# Patient Record
Sex: Male | Born: 1970 | Race: White | Hispanic: No | Marital: Married | Smoking: Former smoker
Health system: Southern US, Community
[De-identification: ages and names within clinical notes are randomized; demographics above are authoritative.]

## PROBLEM LIST (undated history)

## (undated) DIAGNOSIS — S82899A Other fracture of unspecified lower leg, initial encounter for closed fracture: Secondary | ICD-10-CM

## (undated) DIAGNOSIS — T4145XA Adverse effect of unspecified anesthetic, initial encounter: Secondary | ICD-10-CM

## (undated) DIAGNOSIS — E119 Type 2 diabetes mellitus without complications: Secondary | ICD-10-CM

## (undated) DIAGNOSIS — M5126 Other intervertebral disc displacement, lumbar region: Secondary | ICD-10-CM

## (undated) DIAGNOSIS — J329 Chronic sinusitis, unspecified: Secondary | ICD-10-CM

## (undated) DIAGNOSIS — S8292XA Unspecified fracture of left lower leg, initial encounter for closed fracture: Secondary | ICD-10-CM

## (undated) DIAGNOSIS — I10 Essential (primary) hypertension: Secondary | ICD-10-CM

## (undated) DIAGNOSIS — M1990 Unspecified osteoarthritis, unspecified site: Secondary | ICD-10-CM

## (undated) DIAGNOSIS — G4730 Sleep apnea, unspecified: Secondary | ICD-10-CM

## (undated) HISTORY — PX: OTHER SURGICAL HISTORY: SHX169

---

## 1999-11-03 DIAGNOSIS — T8859XA Other complications of anesthesia, initial encounter: Secondary | ICD-10-CM

## 1999-11-03 HISTORY — DX: Other complications of anesthesia, initial encounter: T88.59XA

## 1999-11-03 HISTORY — PX: OTHER SURGICAL HISTORY: SHX169

## 1999-12-04 ENCOUNTER — Other Ambulatory Visit: Admission: RE | Admit: 1999-12-04 | Discharge: 1999-12-04 | Payer: Self-pay

## 2009-11-02 DIAGNOSIS — S82899A Other fracture of unspecified lower leg, initial encounter for closed fracture: Secondary | ICD-10-CM

## 2009-11-02 DIAGNOSIS — S8292XA Unspecified fracture of left lower leg, initial encounter for closed fracture: Secondary | ICD-10-CM

## 2009-11-02 HISTORY — DX: Other fracture of unspecified lower leg, initial encounter for closed fracture: S82.899A

## 2009-11-02 HISTORY — DX: Unspecified fracture of left lower leg, initial encounter for closed fracture: S82.92XA

## 2009-11-02 HISTORY — PX: OTHER SURGICAL HISTORY: SHX169

## 2010-06-20 ENCOUNTER — Inpatient Hospital Stay (HOSPITAL_COMMUNITY): Admission: EM | Admit: 2010-06-20 | Discharge: 2010-06-23 | Payer: Self-pay

## 2010-07-08 ENCOUNTER — Ambulatory Visit (HOSPITAL_COMMUNITY): Admission: RE | Admit: 2010-07-08 | Discharge: 2010-07-09 | Payer: Self-pay | Admitting: Orthopedic Surgery

## 2010-12-29 IMAGING — CT CT EXTREM LOW W/O CM*L*
3 series · 16 of 33 positions shown, 19 images · non-contrast
Comparison: Left ankle radiographs 06/20/2010

CLINICAL DATA: Evaluate left ankle fracture.  Status post external
fixation.

CT LEFT ANKLE WITHOUT CONTRAST
TECHNIQUE: Multidetector CT imaging of the left ankle was
performed according to the standard protocol without intravenous
contrast. Multiplanar CT image reconstructions were also generated.

[Series 4: lt ankle 2 · axial · 0.43mm/px · z∈[-500,-284]mm · 8 of 128 slices shown, 10 images]
[im 10/128  soft-tissue]
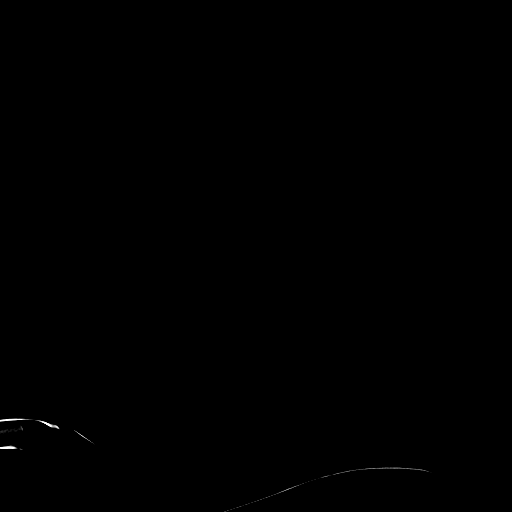
[im 10/128  bone]
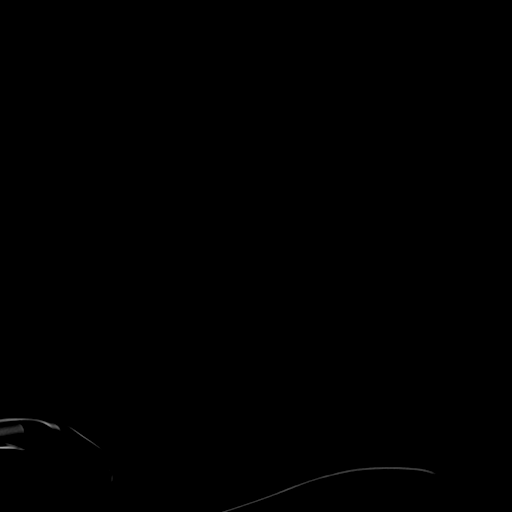
[im 30/128  bone]
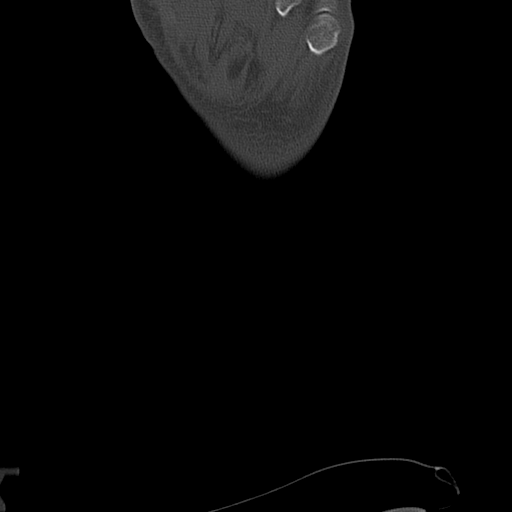
[im 40/128  bone]
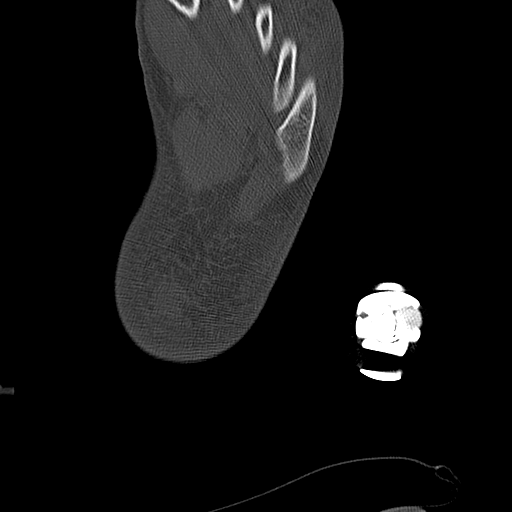
[im 59/128  bone]
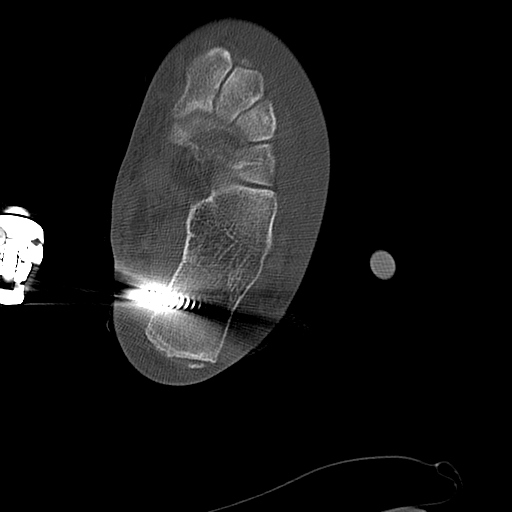
[im 69/128  soft-tissue]
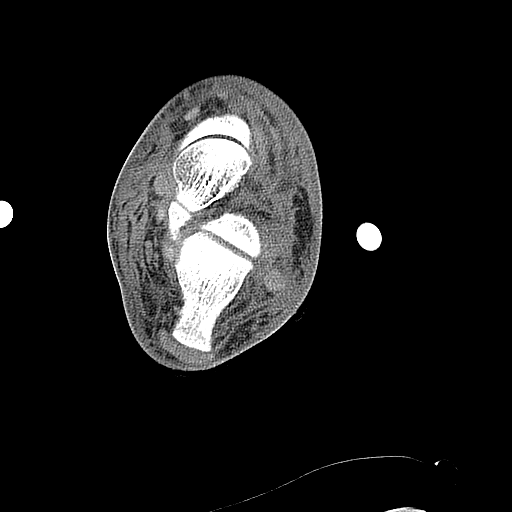
[im 69/128  bone]
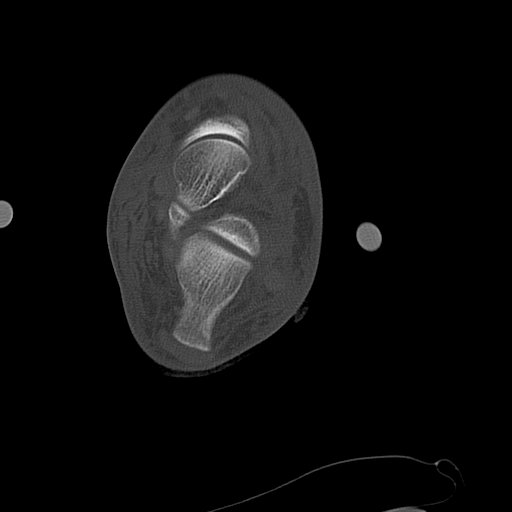
[im 88/128  bone]
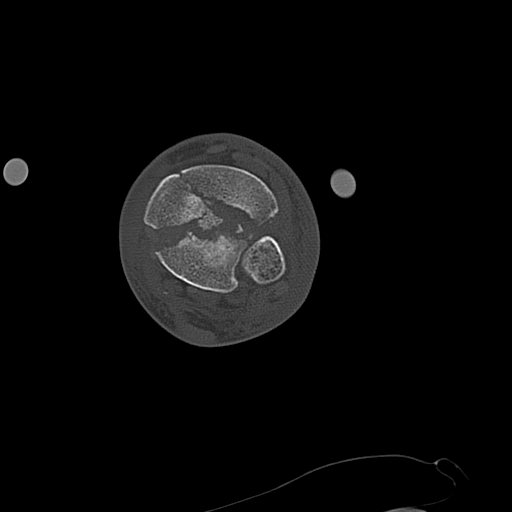
[im 98/128  bone]
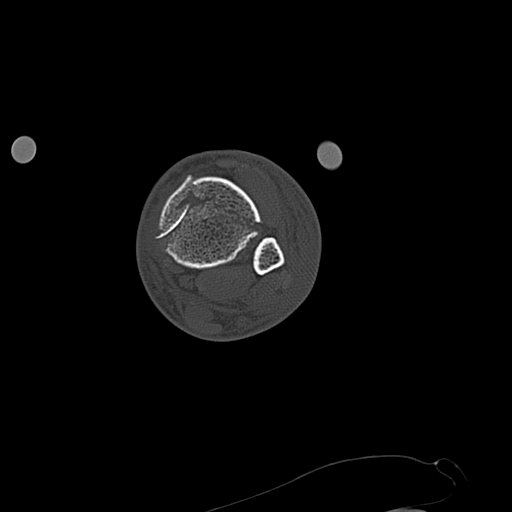
[im 118/128  bone]
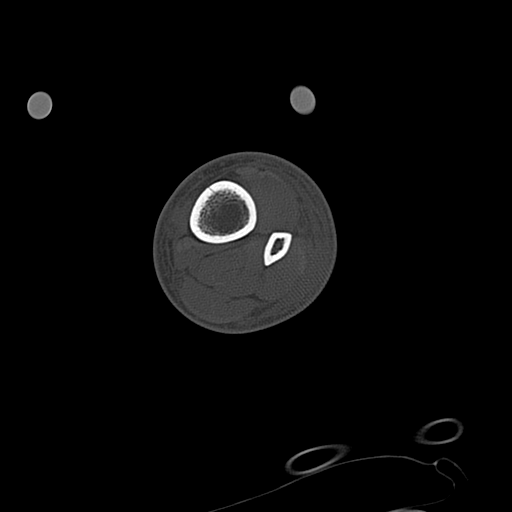

[Series 604: lt ankle cor · coronal · 0.50mm/px · 3 of 76 slices shown]
[im 16/76  bone]
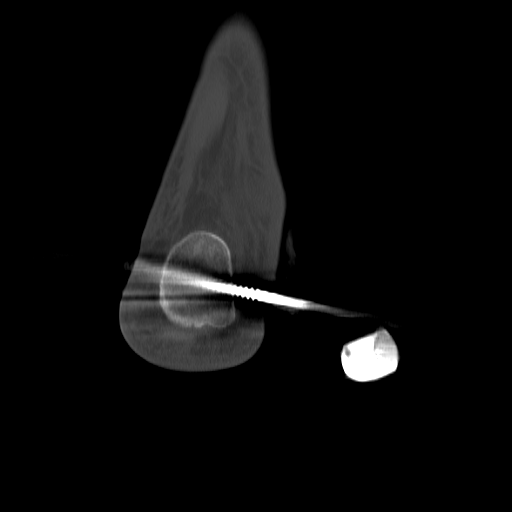
[im 31/76  bone]
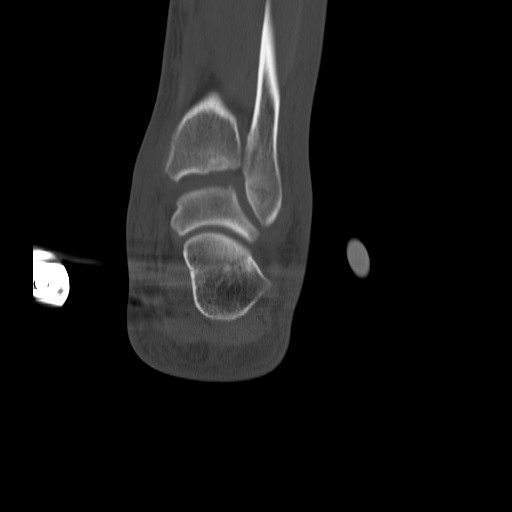
[im 46/76  bone]
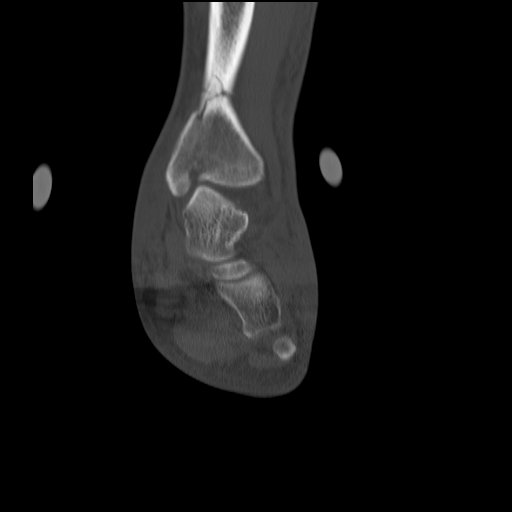

[Series 605: sag lt ankle · sagittal · 0.50mm/px · 5 of 48 slices shown, 6 images]
[im 16/48  bone]
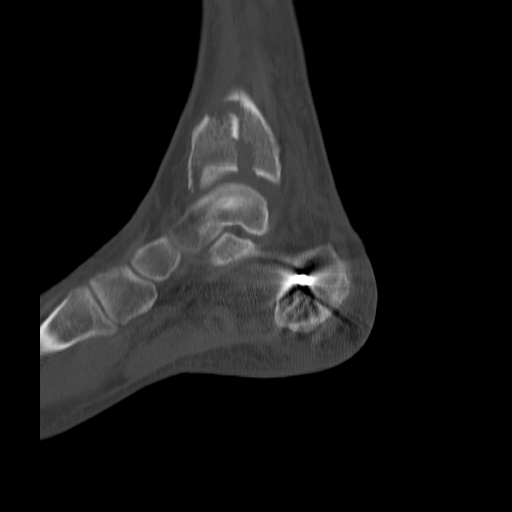
[im 20/48  bone]
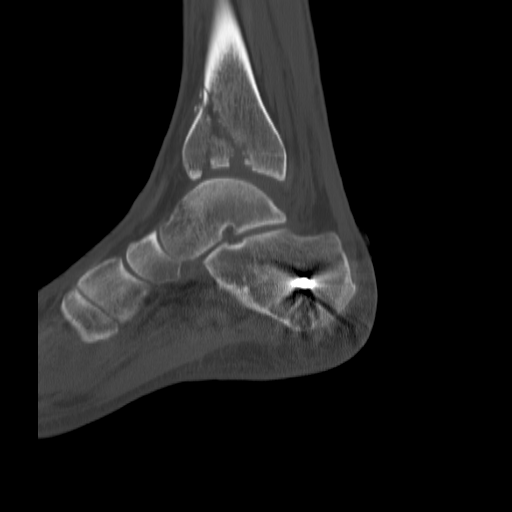
[im 24/48  soft-tissue]
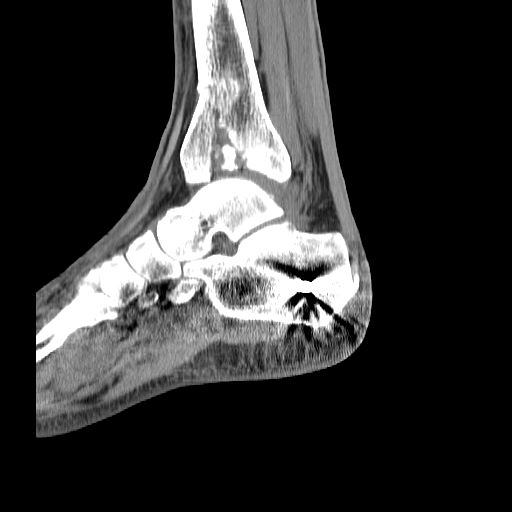
[im 24/48  bone]
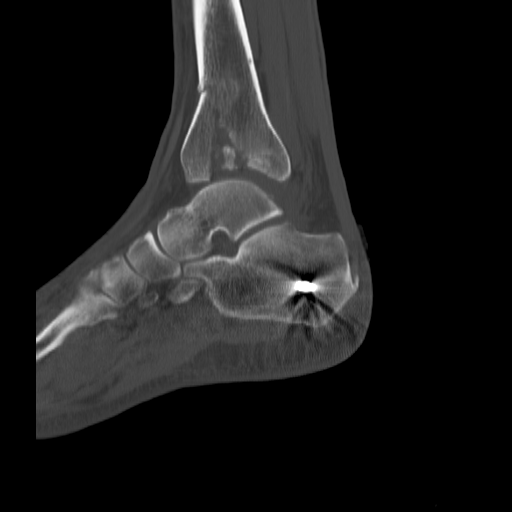
[im 28/48  bone]
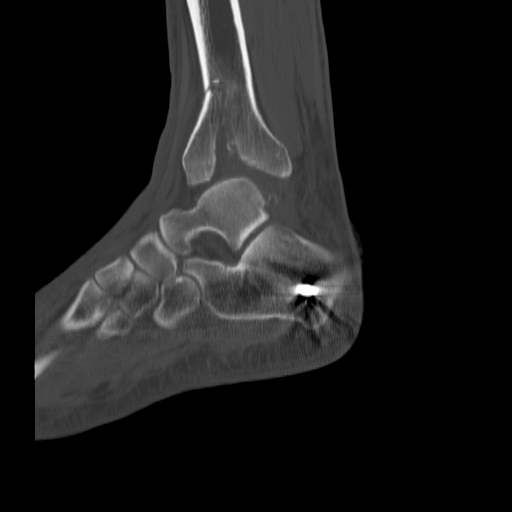
[im 32/48  bone]
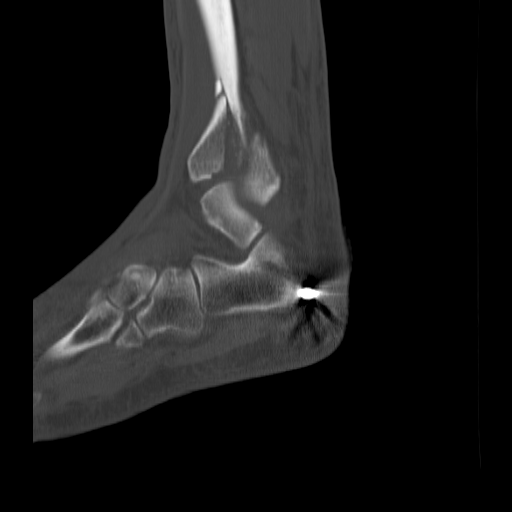

[16 of 33 positions shown; findings below may reference images not displayed]

FINDINGS: There is an external fixator in place with tibial and
calcaneal screws.  There is a nondisplaced fracture involving the
calcaneus along its plantar are aspect posteriorly.  The body of
the calcaneus is intact and the talus and subtalar joints are
intact.

There is a severely comminuted die punch type fracture involving
the distal tibia with severe comminution and fragmentation.  There
is a large gap in the articular surface.

There is widening of the talofibular joint space posteriorly with
small bony fragments near the posterior talofibular ligament
attachment suggesting ligament tear and avulsion.
IMPRESSION: 1.  Severely comminuted die punch type fracture involving the
distal tibia and tibial plafond with multiple small bone fragments
and large gaps in the articular surface.
2.  Nondisplaced fracture involving the plantar and posterior
aspect of the calcaneus.
3.  Findings suspicious for a a posterior talofibular ligament tear
with small bony avulsions.

## 2011-01-15 LAB — CBC
HCT: 39.3 % (ref 39.0–52.0)
Hemoglobin: 13.4 g/dL (ref 13.0–17.0)
MCH: 30 pg (ref 26.0–34.0)
MCHC: 34.1 g/dL (ref 30.0–36.0)
MCV: 87.9 fL (ref 78.0–100.0)
Platelets: 470 10*3/uL — ABNORMAL HIGH (ref 150–400)
RBC: 4.47 MIL/uL (ref 4.22–5.81)
RDW: 11.9 % (ref 11.5–15.5)
WBC: 8 10*3/uL (ref 4.0–10.5)

## 2011-01-15 LAB — BASIC METABOLIC PANEL
BUN: 17 mg/dL (ref 6–23)
CO2: 30 mEq/L (ref 19–32)
Calcium: 9.7 mg/dL (ref 8.4–10.5)
Chloride: 103 mEq/L (ref 96–112)
Creatinine, Ser: 0.73 mg/dL (ref 0.4–1.5)
GFR calc Af Amer: >60 mL/min (ref >60)
GFR calc non Af Amer: >60 mL/min (ref >60)
Glucose, Bld: 116 mg/dL — ABNORMAL HIGH (ref 70–99)
Potassium: 4.6 mEq/L (ref 3.5–5.1)
Sodium: 141 mEq/L (ref 135–145)

## 2011-01-15 LAB — SURGICAL PCR SCREEN
MRSA, PCR: NEGATIVE
Staphylococcus aureus: POSITIVE — AB

## 2011-01-15 LAB — PROTIME-INR
INR: 1.01 (ref 0.00–1.49)
INR: 1.08 (ref 0.00–1.49)
Prothrombin Time: 13.5 seconds (ref 11.6–15.2)
Prothrombin Time: 14.2 seconds (ref 11.6–15.2)

## 2011-01-16 LAB — BASIC METABOLIC PANEL
BUN: 10 mg/dL (ref 6–23)
BUN: 7 mg/dL (ref 6–23)
CO2: 26 mEq/L (ref 19–32)
CO2: 29 mEq/L (ref 19–32)
Calcium: 8.3 mg/dL — ABNORMAL LOW (ref 8.4–10.5)
Calcium: 8.4 mg/dL (ref 8.4–10.5)
Chloride: 101 mEq/L (ref 96–112)
Chloride: 102 mEq/L (ref 96–112)
Creatinine, Ser: 0.63 mg/dL (ref 0.4–1.5)
Creatinine, Ser: 0.8 mg/dL (ref 0.4–1.5)
GFR calc Af Amer: >60 mL/min (ref >60)
GFR calc Af Amer: >60 mL/min (ref >60)
GFR calc non Af Amer: >60 mL/min (ref >60)
GFR calc non Af Amer: >60 mL/min (ref >60)
Glucose, Bld: 127 mg/dL — ABNORMAL HIGH (ref 70–99)
Glucose, Bld: 136 mg/dL — ABNORMAL HIGH (ref 70–99)
Potassium: 3.4 mEq/L — ABNORMAL LOW (ref 3.5–5.1)
Potassium: 3.6 mEq/L (ref 3.5–5.1)
Sodium: 135 mEq/L (ref 135–145)
Sodium: 136 mEq/L (ref 135–145)

## 2011-01-16 LAB — SAMPLE TO BLOOD BANK

## 2011-01-16 LAB — CBC
HCT: 35 % — ABNORMAL LOW (ref 39.0–52.0)
HCT: 35.1 % — ABNORMAL LOW (ref 39.0–52.0)
HCT: 40.3 % (ref 39.0–52.0)
Hemoglobin: 12.1 g/dL — ABNORMAL LOW (ref 13.0–17.0)
Hemoglobin: 12.3 g/dL — ABNORMAL LOW (ref 13.0–17.0)
Hemoglobin: 14.6 g/dL (ref 13.0–17.0)
MCH: 31.1 pg (ref 26.0–34.0)
MCH: 31.6 pg (ref 26.0–34.0)
MCH: 32 pg (ref 26.0–34.0)
MCHC: 34.6 g/dL (ref 30.0–36.0)
MCHC: 35 g/dL (ref 30.0–36.0)
MCHC: 36.2 g/dL — ABNORMAL HIGH (ref 30.0–36.0)
MCV: 88.4 fL (ref 78.0–100.0)
MCV: 88.9 fL (ref 78.0–100.0)
MCV: 91.4 fL (ref 78.0–100.0)
Platelets: 180 10*3/uL (ref 150–400)
Platelets: 213 10*3/uL (ref 150–400)
Platelets: 270 10*3/uL (ref 150–400)
RBC: 3.83 MIL/uL — ABNORMAL LOW (ref 4.22–5.81)
RBC: 3.95 MIL/uL — ABNORMAL LOW (ref 4.22–5.81)
RBC: 4.56 MIL/uL (ref 4.22–5.81)
RDW: 12.2 % (ref 11.5–15.5)
RDW: 12.3 % (ref 11.5–15.5)
RDW: 12.3 % (ref 11.5–15.5)
WBC: 11.1 10*3/uL — ABNORMAL HIGH (ref 4.0–10.5)
WBC: 20 10*3/uL — ABNORMAL HIGH (ref 4.0–10.5)
WBC: 8.1 10*3/uL (ref 4.0–10.5)

## 2011-01-16 LAB — COMPREHENSIVE METABOLIC PANEL
ALT: 50 U/L (ref 0–53)
AST: 38 U/L — ABNORMAL HIGH (ref 0–37)
Albumin: 4.3 g/dL (ref 3.5–5.2)
Alkaline Phosphatase: 50 U/L (ref 39–117)
BUN: 16 mg/dL (ref 6–23)
CO2: 24 meq/L (ref 19–32)
Calcium: 8.8 mg/dL (ref 8.4–10.5)
Chloride: 102 mEq/L (ref 96–112)
Creatinine, Ser: 0.82 mg/dL (ref 0.4–1.5)
GFR calc Af Amer: >60 mL/min (ref >60)
GFR calc non Af Amer: >60 mL/min (ref >60)
Glucose, Bld: 173 mg/dL — ABNORMAL HIGH (ref 70–99)
Potassium: 2.9 meq/L — ABNORMAL LOW (ref 3.5–5.1)
Sodium: 134 mEq/L — ABNORMAL LOW (ref 135–145)
Total Bilirubin: 0.6 mg/dL (ref 0.3–1.2)
Total Protein: 6.9 g/dL (ref 6.0–8.3)

## 2011-01-16 LAB — PROTIME-INR
INR: 1.03 (ref 0.00–1.49)
Prothrombin Time: 13.7 s (ref 11.6–15.2)

## 2011-01-16 LAB — APTT: aPTT: 26 seconds (ref 24–37)

## 2011-01-16 LAB — LACTIC ACID, PLASMA: Lactic Acid, Venous: 3.6 mmol/L — ABNORMAL HIGH (ref 0.5–2.2)

## 2016-11-09 DIAGNOSIS — Z3141 Encounter for fertility testing: Secondary | ICD-10-CM | POA: Diagnosis not present

## 2017-01-19 DIAGNOSIS — Z113 Encounter for screening for infections with a predominantly sexual mode of transmission: Secondary | ICD-10-CM | POA: Diagnosis not present

## 2017-01-19 DIAGNOSIS — Z3169 Encounter for other general counseling and advice on procreation: Secondary | ICD-10-CM | POA: Diagnosis not present

## 2017-01-19 DIAGNOSIS — Z31440 Encounter of male for testing for genetic disease carrier status for procreative management: Secondary | ICD-10-CM | POA: Diagnosis not present

## 2017-05-19 DIAGNOSIS — M9905 Segmental and somatic dysfunction of pelvic region: Secondary | ICD-10-CM | POA: Diagnosis not present

## 2017-05-19 DIAGNOSIS — M9903 Segmental and somatic dysfunction of lumbar region: Secondary | ICD-10-CM | POA: Diagnosis not present

## 2017-05-19 DIAGNOSIS — M542 Cervicalgia: Secondary | ICD-10-CM | POA: Diagnosis not present

## 2017-05-19 DIAGNOSIS — M545 Low back pain: Secondary | ICD-10-CM | POA: Diagnosis not present

## 2017-05-25 DIAGNOSIS — M9905 Segmental and somatic dysfunction of pelvic region: Secondary | ICD-10-CM | POA: Diagnosis not present

## 2017-05-25 DIAGNOSIS — M9903 Segmental and somatic dysfunction of lumbar region: Secondary | ICD-10-CM | POA: Diagnosis not present

## 2017-05-25 DIAGNOSIS — J339 Nasal polyp, unspecified: Secondary | ICD-10-CM | POA: Diagnosis not present

## 2017-05-25 DIAGNOSIS — M545 Low back pain: Secondary | ICD-10-CM | POA: Diagnosis not present

## 2017-05-25 DIAGNOSIS — M542 Cervicalgia: Secondary | ICD-10-CM | POA: Diagnosis not present

## 2017-05-25 DIAGNOSIS — J019 Acute sinusitis, unspecified: Secondary | ICD-10-CM | POA: Diagnosis not present

## 2017-05-25 DIAGNOSIS — I10 Essential (primary) hypertension: Secondary | ICD-10-CM | POA: Diagnosis not present

## 2017-06-01 DIAGNOSIS — J019 Acute sinusitis, unspecified: Secondary | ICD-10-CM | POA: Diagnosis not present

## 2017-06-01 DIAGNOSIS — J320 Chronic maxillary sinusitis: Secondary | ICD-10-CM | POA: Diagnosis not present

## 2017-06-01 DIAGNOSIS — M9903 Segmental and somatic dysfunction of lumbar region: Secondary | ICD-10-CM | POA: Diagnosis not present

## 2017-06-01 DIAGNOSIS — M545 Low back pain: Secondary | ICD-10-CM | POA: Diagnosis not present

## 2017-06-01 DIAGNOSIS — M542 Cervicalgia: Secondary | ICD-10-CM | POA: Diagnosis not present

## 2017-06-01 DIAGNOSIS — M9905 Segmental and somatic dysfunction of pelvic region: Secondary | ICD-10-CM | POA: Diagnosis not present

## 2017-06-15 DIAGNOSIS — J339 Nasal polyp, unspecified: Secondary | ICD-10-CM | POA: Diagnosis not present

## 2017-06-15 DIAGNOSIS — J329 Chronic sinusitis, unspecified: Secondary | ICD-10-CM | POA: Diagnosis not present

## 2017-06-15 DIAGNOSIS — J019 Acute sinusitis, unspecified: Secondary | ICD-10-CM | POA: Diagnosis not present

## 2017-06-22 DIAGNOSIS — J330 Polyp of nasal cavity: Secondary | ICD-10-CM | POA: Diagnosis not present

## 2017-06-22 DIAGNOSIS — J342 Deviated nasal septum: Secondary | ICD-10-CM | POA: Diagnosis not present

## 2017-06-22 DIAGNOSIS — J324 Chronic pansinusitis: Secondary | ICD-10-CM | POA: Diagnosis not present

## 2017-06-22 DIAGNOSIS — J0191 Acute recurrent sinusitis, unspecified: Secondary | ICD-10-CM | POA: Diagnosis not present

## 2017-10-06 DIAGNOSIS — M545 Low back pain: Secondary | ICD-10-CM | POA: Diagnosis not present

## 2017-10-06 DIAGNOSIS — M9903 Segmental and somatic dysfunction of lumbar region: Secondary | ICD-10-CM | POA: Diagnosis not present

## 2017-10-06 DIAGNOSIS — M542 Cervicalgia: Secondary | ICD-10-CM | POA: Diagnosis not present

## 2017-10-06 DIAGNOSIS — M9905 Segmental and somatic dysfunction of pelvic region: Secondary | ICD-10-CM | POA: Diagnosis not present

## 2017-10-28 DIAGNOSIS — M542 Cervicalgia: Secondary | ICD-10-CM | POA: Diagnosis not present

## 2017-10-28 DIAGNOSIS — M9903 Segmental and somatic dysfunction of lumbar region: Secondary | ICD-10-CM | POA: Diagnosis not present

## 2017-10-28 DIAGNOSIS — M9905 Segmental and somatic dysfunction of pelvic region: Secondary | ICD-10-CM | POA: Diagnosis not present

## 2017-10-28 DIAGNOSIS — M545 Low back pain: Secondary | ICD-10-CM | POA: Diagnosis not present

## 2017-11-03 DIAGNOSIS — M545 Low back pain: Secondary | ICD-10-CM | POA: Diagnosis not present

## 2017-11-03 DIAGNOSIS — M5126 Other intervertebral disc displacement, lumbar region: Secondary | ICD-10-CM | POA: Diagnosis not present

## 2017-11-09 DIAGNOSIS — M545 Low back pain: Secondary | ICD-10-CM | POA: Diagnosis not present

## 2017-11-22 DIAGNOSIS — M545 Low back pain: Secondary | ICD-10-CM | POA: Diagnosis not present

## 2017-12-21 ENCOUNTER — Other Ambulatory Visit (HOSPITAL_COMMUNITY): Payer: Self-pay | Admitting: *Deleted

## 2017-12-21 NOTE — Patient Instructions (Addendum)
Brandon Knox  12/21/2017   Your procedure is scheduled on: Tuesday 12-28-17  [facility]  Report to admitting at 1030 AM  Call this number if you have problems the morning of surgery [phone]   Remember: Do not eat food or drink liquids :After Midnight.     Take these medicines the morning of surgery with A SIP OF WATER: NONE                               You may not have any metal on your body including hair pins and              piercings  Do not wear jewelry, make-up, lotions, powders or perfumes, deodorant             Do not wear nail polish.  Do not shave  48 hours prior to surgery.              Men may shave face and neck.   Do not bring valuables to the hospital. [facility] IS NOT             RESPONSIBLE   FOR VALUABLES.  Contacts, dentures or bridgework may not be worn into surgery.  Leave suitcase in the car. After surgery it may be brought to your room.                  Please read over the following fact sheets you were given: _____________________________________________________________________             [facility] Pre[facility] Before surgery, you can play an important role.  Because skin is not sterile, your skin needs to be as free of germs as possible.  You can reduce the number of germs on your skin by washing with CHG (chlorahexidine gluconate) soap before surgery.  CHG is an antiseptic cleaner which kills germs and bonds with the skin to continue killing germs even after washing. Please DO NOT use if you have an allergy to CHG or antibacterial soaps.  If your skin becomes reddened/irritated stop using the CHG and inform your nurse when you arrive at Short Stay. Do not shave (including legs and underarms) for at least 48 hours prior to the first CHG shower.  You may shave your face/neck. Please follow these instructions carefully:  1.  Shower with CHG Soap the night before surgery and the   morning of Surgery.  2.  If you choose to wash your hair, wash your hair first as usual with your  normal  shampoo.  3.  After you shampoo, rinse your hair and body thoroughly to remove the  shampoo.                           4.  Use CHG as you would any other liquid soap.  You can apply chg directly  to the skin and wash                       Gently with a scrungie or clean washcloth.  5.  Apply the CHG Soap to your body ONLY FROM THE NECK DOWN.   Do not use on face/ open  Wound or open sores. Avoid contact with eyes, ears mouth and genitals (private parts).                       Wash face,  Genitals (private parts) with your normal soap.             6.  Wash thoroughly, paying special attention to the area where your surgery  will be performed.  7.  Thoroughly rinse your body with warm water from the neck down.  8.  DO NOT shower/wash with your normal soap after using and rinsing off  the CHG Soap.                9.  Pat yourself dry with a clean towel.            10.  Wear clean pajamas.            11.  Place clean sheets on your bed the night of your first shower and do not  sleep with pets. Day of Surgery : Do not apply any lotions/deodorants the morning of surgery.  Please wear clean clothes to the hospital/surgery center.  FAILURE TO FOLLOW THESE INSTRUCTIONS MAY RESULT IN THE CANCELLATION OF YOUR SURGERY PATIENT SIGNATURE_________________________________  NURSE SIGNATURE__________________________________  ________________________________________________________________________   Brandon Knox  An incentive spirometer is a tool that can help keep your lungs clear and active. This tool measures how well you are filling your lungs with each breath. Taking long deep breaths may help reverse or decrease the chance of developing breathing (pulmonary) problems (especially infection) following:  A long period of time when you are unable to move or be  active. BEFORE THE PROCEDURE   If the spirometer includes an indicator to show your best effort, your nurse or respiratory therapist will set it to a desired goal.  If possible, sit up straight or lean slightly forward. Try not to slouch.  Hold the incentive spirometer in an upright position. INSTRUCTIONS FOR USE  1. Sit on the edge of your bed if possible, or sit up as far as you can in bed or on a chair. 2. Hold the incentive spirometer in an upright position. 3. Breathe out normally. 4. Place the mouthpiece in your mouth and seal your lips tightly around it. 5. Breathe in slowly and as deeply as possible, raising the piston or the ball toward the top of the column. 6. Hold your breath for 3-5 seconds or for as long as possible. Allow the piston or ball to fall to the bottom of the column. 7. Remove the mouthpiece from your mouth and breathe out normally. 8. Rest for a few seconds and repeat Steps 1 through 7 at least 10 times every 1-2 hours when you are awake. Take your time and take a few normal breaths between deep breaths. 9. The spirometer may include an indicator to show your best effort. Use the indicator as a goal to work toward during each repetition. 10. After each set of 10 deep breaths, practice coughing to be sure your lungs are clear. If you have an incision (the cut made at the time of surgery), support your incision when coughing by placing a pillow or rolled up towels firmly against it. Once you are able to get out of bed, walk around indoors and cough well. You may stop using the incentive spirometer when instructed by your caregiver.  RISKS AND COMPLICATIONS  Take your time so you do not get  dizzy or light-headed.  If you are in pain, you may need to take or ask for pain medication before doing incentive spirometry. It is harder to take a deep breath if you are having pain. AFTER USE  Rest and breathe slowly and easily.  It can be helpful to keep track of a log of  your progress. Your caregiver can provide you with a simple table to help with this. If you are using the spirometer at home, follow these instructions: [place] IF:   You are having difficultly using the spirometer.  You have trouble using the spirometer as often as instructed.  Your pain medication is not giving enough relief while using the spirometer.  You develop fever of 100.5 F (38.1 C) or higher. SEEK IMMEDIATE MEDICAL CARE IF:   You cough up bloody sputum that had not been present before.  You develop fever of 102 F (38.9 C) or greater.  You develop worsening pain at or near the incision site. MAKE SURE YOU:   Understand these instructions.  Will watch your condition.  Will get help right away if you are not doing well or get worse. Document Released: 03/01/2007 Document Revised: 01/11/2012 Document Reviewed: 05/02/2007 Exit[employer] Patient Information 2014 Exit[employer], [place].   ________________________________________________________________________  WHAT IS A BLOOD TRANSFUSION? Blood Transfusion Information  A transfusion is the replacement of blood or some of its parts. Blood is made up of multiple cells which provide different functions.  Red blood cells carry oxygen and are used for blood loss replacement.  White blood cells fight against infection.  Platelets control bleeding.  Plasma helps clot blood.  Other blood products are available for specialized needs, such as hemophilia or other clotting disorders. BEFORE THE TRANSFUSION  Who gives blood for transfusions?   Healthy volunteers who are fully evaluated to make sure their blood is safe. This is blood bank blood. Transfusion therapy is the safest it has ever been in the practice of medicine. Before blood is taken from a donor, a complete history is taken to make sure that person has no history of diseases nor engages in risky social behavior (examples are intravenous drug use or sexual activity  with multiple partners). The donor's travel history is screened to minimize risk of transmitting infections, such as malaria. The donated blood is tested for signs of infectious diseases, such as HIV and hepatitis. The blood is then tested to be sure it is compatible with you in order to minimize the chance of a transfusion reaction. If you or a relative donates blood, this is often done in anticipation of surgery and is not appropriate for emergency situations. It takes many days to process the donated blood. RISKS AND COMPLICATIONS Although transfusion therapy is very safe and saves many lives, the main dangers of transfusion include:   Getting an infectious disease.  Developing a transfusion reaction. This is an allergic reaction to something in the blood you were given. Every precaution is taken to prevent this. The decision to have a blood transfusion has been considered carefully by your caregiver before blood is given. Blood is not given unless the benefits outweigh the risks. AFTER THE TRANSFUSION  Right after receiving a blood transfusion, you will usually feel much better and more energetic. This is especially true if your red blood cells have gotten low (anemic). The transfusion raises the level of the red blood cells which carry oxygen, and this usually causes an energy increase.  The nurse administering the transfusion will  monitor you carefully for complications. HOME CARE INSTRUCTIONS  No special instructions are needed after a transfusion. You may find your energy is better. Speak with your caregiver about any limitations on activity for underlying diseases you may have. SEEK MEDICAL CARE IF:   Your condition is not improving after your transfusion.  You develop redness or irritation at the intravenous (IV) site. SEEK IMMEDIATE MEDICAL CARE IF:  Any of the following symptoms occur over the next 12 hours:  Shaking chills.  You have a temperature by mouth above 102 F (38.9  C), not controlled by medicine.  Chest, back, or muscle pain.  People around you feel you are not acting correctly or are confused.  Shortness of breath or difficulty breathing.  Dizziness and fainting.  You get a rash or develop hives.  You have a decrease in urine output.  Your urine turns a dark color or changes to pink, red, or brown. Any of the following symptoms occur over the next 10 days:  You have a temperature by mouth above 102 F (38.9 C), not controlled by medicine.  Shortness of breath.  Weakness after normal activity.  The white part of the eye turns yellow (jaundice).  You have a decrease in the amount of urine or are urinating less often.  Your urine turns a dark color or changes to pink, red, or brown. Document Released: 10/16/2000 Document Revised: 01/11/2012 Document Reviewed: 06/04/2008 [facility] Patient Information 2014 [place], [place].  _______________________________________________________________________

## 2017-12-23 ENCOUNTER — Encounter (INDEPENDENT_AMBULATORY_CARE_PROVIDER_SITE_OTHER): Payer: Self-pay

## 2017-12-23 ENCOUNTER — Ambulatory Visit (HOSPITAL_COMMUNITY)
Admission: RE | Admit: 2017-12-23 | Discharge: 2017-12-23 | Disposition: A | Discharge status: Home / Self Care | Payer: Federal, State, Local not specified - PPO | Source: Ambulatory Visit | Attending: Surgical | Admitting: Surgical

## 2017-12-23 ENCOUNTER — Encounter (HOSPITAL_COMMUNITY): Payer: Self-pay

## 2017-12-23 ENCOUNTER — Other Ambulatory Visit: Payer: Self-pay

## 2017-12-23 ENCOUNTER — Encounter (HOSPITAL_COMMUNITY)
Admission: RE | Admit: 2017-12-23 | Discharge: 2017-12-23 | Disposition: A | Discharge status: Home / Self Care | Payer: Federal, State, Local not specified - PPO | Source: Ambulatory Visit | Attending: Orthopedic Surgery | Admitting: Orthopedic Surgery

## 2017-12-23 DIAGNOSIS — Z01810 Encounter for preprocedural cardiovascular examination: Secondary | ICD-10-CM | POA: Diagnosis not present

## 2017-12-23 DIAGNOSIS — Z01812 Encounter for preprocedural laboratory examination: Secondary | ICD-10-CM | POA: Insufficient documentation

## 2017-12-23 DIAGNOSIS — Z01818 Encounter for other preprocedural examination: Secondary | ICD-10-CM | POA: Diagnosis not present

## 2017-12-23 DIAGNOSIS — M545 Low back pain, unspecified: Secondary | ICD-10-CM

## 2017-12-23 DIAGNOSIS — M5126 Other intervertebral disc displacement, lumbar region: Secondary | ICD-10-CM | POA: Insufficient documentation

## 2017-12-23 DIAGNOSIS — I700 Atherosclerosis of aorta: Secondary | ICD-10-CM | POA: Insufficient documentation

## 2017-12-23 DIAGNOSIS — M5137 Other intervertebral disc degeneration, lumbosacral region: Secondary | ICD-10-CM | POA: Diagnosis not present

## 2017-12-23 DIAGNOSIS — M4807 Spinal stenosis, lumbosacral region: Secondary | ICD-10-CM | POA: Insufficient documentation

## 2017-12-23 HISTORY — DX: Unspecified osteoarthritis, unspecified site: M19.90

## 2017-12-23 HISTORY — DX: Other intervertebral disc displacement, lumbar region: M51.26

## 2017-12-23 HISTORY — DX: Essential (primary) hypertension: I10

## 2017-12-23 HISTORY — DX: Other fracture of unspecified lower leg, initial encounter for closed fracture: S82.899A

## 2017-12-23 HISTORY — DX: Unspecified fracture of left lower leg, initial encounter for closed fracture: S82.92XA

## 2017-12-23 HISTORY — DX: Adverse effect of unspecified anesthetic, initial encounter: T41.45XA

## 2017-12-23 HISTORY — DX: Sleep apnea, unspecified: G47.30

## 2017-12-23 HISTORY — DX: Chronic sinusitis, unspecified: J32.9

## 2017-12-23 LAB — CBC WITH DIFFERENTIAL/PLATELET
Basophils Absolute: 0 K/uL (ref 0.0–0.1)
Basophils Relative: 0 %
Eosinophils Absolute: 0.2 K/uL (ref 0.0–0.7)
Eosinophils Relative: 3 %
HCT: 47.9 % (ref 39.0–52.0)
Hemoglobin: 17.6 g/dL — ABNORMAL HIGH (ref 13.0–17.0)
Lymphocytes Relative: 38 %
Lymphs Abs: 2.9 K/uL (ref 0.7–4.0)
MCH: 31.9 pg (ref 26.0–34.0)
MCHC: 36.7 g/dL — ABNORMAL HIGH (ref 30.0–36.0)
MCV: 86.8 fL (ref 78.0–100.0)
Monocytes Absolute: 0.4 K/uL (ref 0.1–1.0)
Monocytes Relative: 5 %
Neutro Abs: 4.1 K/uL (ref 1.7–7.7)
Neutrophils Relative %: 54 %
Platelets: 256 K/uL (ref 150–400)
RBC: 5.52 MIL/uL (ref 4.22–5.81)
RDW: 11.8 % (ref 11.5–15.5)
WBC: 7.6 K/uL (ref 4.0–10.5)

## 2017-12-23 LAB — URINALYSIS, ROUTINE W REFLEX MICROSCOPIC
Bilirubin Urine: NEGATIVE
Glucose, UA: >=500 mg/dL — AB
Hgb urine dipstick: NEGATIVE
Ketones, ur: 20 mg/dL — AB
Leukocytes, UA: NEGATIVE
Nitrite: NEGATIVE
Protein, ur: NEGATIVE mg/dL
Specific Gravity, Urine: 1.032 — ABNORMAL HIGH (ref 1.005–1.030)
Squamous Epithelial / HPF: NONE SEEN
pH: 5 (ref 5.0–8.0)

## 2017-12-23 LAB — COMPREHENSIVE METABOLIC PANEL WITH GFR
ALT: 31 U/L (ref 17–63)
AST: 23 U/L (ref 15–41)
Albumin: 4.8 g/dL (ref 3.5–5.0)
Alkaline Phosphatase: 89 U/L (ref 38–126)
Anion gap: 12 (ref 5–15)
BUN: 15 mg/dL (ref 6–20)
CO2: 25 mmol/L (ref 22–32)
Calcium: 9.5 mg/dL (ref 8.9–10.3)
Chloride: 97 mmol/L — ABNORMAL LOW (ref 101–111)
Creatinine, Ser: 0.65 mg/dL (ref 0.61–1.24)
GFR calc Af Amer: >60 mL/min (ref >60)
GFR calc non Af Amer: >60 mL/min (ref >60)
Glucose, Bld: 311 mg/dL — ABNORMAL HIGH (ref 65–99)
Potassium: 4.1 mmol/L (ref 3.5–5.1)
Sodium: 134 mmol/L — ABNORMAL LOW (ref 135–145)
Total Bilirubin: 0.9 mg/dL (ref 0.3–1.2)
Total Protein: 7.9 g/dL (ref 6.5–8.1)

## 2017-12-23 LAB — SURGICAL PCR SCREEN
MRSA, PCR: NEGATIVE
Staphylococcus aureus: NEGATIVE

## 2017-12-23 LAB — PROTIME-INR
INR: 0.97
Prothrombin Time: 12.8 s (ref 11.4–15.2)

## 2017-12-23 LAB — APTT: aPTT: 31 s (ref 24–36)

## 2017-12-23 NOTE — Progress Notes (Signed)
cmet results routed to dr Darrelyn Hillockgioffre and left message with velvet mcbride about cmet results

## 2017-12-23 NOTE — Progress Notes (Signed)
   12/23/17 1128  OBSTRUCTIVE SLEEP APNEA  Have you ever been diagnosed with sleep apnea through a sleep study? No (TOLD BY ANESTHESIA AFTER 2001 SURGERY HAS SLEEP APNEA)  Do you snore loudly (loud enough to be heard through closed doors)?  1  Do you often feel tired, fatigued, or sleepy during the daytime (such as falling asleep during driving or talking to someone)? 1  Has anyone observed you stop breathing during your sleep? 1  Do you have, or are you being treated for high blood pressure? 1  BMI more than 35 kg/m2? 0  Age > 50 (1-yes) 0  Neck circumference greater than:Male 16 inches or larger, Male 17inches or larger? 0  Male Gender (Yes=1) 1  Obstructive Sleep Apnea Score 5

## 2017-12-24 DIAGNOSIS — Z7689 Persons encountering health services in other specified circumstances: Secondary | ICD-10-CM | POA: Diagnosis not present

## 2017-12-24 DIAGNOSIS — E663 Overweight: Secondary | ICD-10-CM | POA: Diagnosis not present

## 2017-12-24 DIAGNOSIS — I10 Essential (primary) hypertension: Secondary | ICD-10-CM | POA: Diagnosis not present

## 2017-12-24 LAB — ABO/RH: ABO/RH(D): A NEG

## 2017-12-27 MED ORDER — BUPIVACAINE LIPOSOME 1.3 % IJ SUSP
20.0000 mL | Freq: Once | INTRAMUSCULAR | Status: DC
  Filled 2017-12-27: qty 20

## 2017-12-28 ENCOUNTER — Encounter (HOSPITAL_COMMUNITY): Admission: RE | Payer: Self-pay | Source: Ambulatory Visit

## 2017-12-28 ENCOUNTER — Ambulatory Visit (HOSPITAL_COMMUNITY)
Admission: RE | Admit: 2017-12-28 | Payer: Federal, State, Local not specified - PPO | Source: Ambulatory Visit | Admitting: Orthopedic Surgery

## 2017-12-28 LAB — TYPE AND SCREEN
ABO/RH(D): A NEG
Antibody Screen: NEGATIVE

## 2017-12-28 SURGERY — DECOMPRESSIVE LUMBAR LAMINECTOMY LEVEL 1
Anesthesia: General

## 2018-01-11 DIAGNOSIS — K5900 Constipation, unspecified: Secondary | ICD-10-CM | POA: Diagnosis not present

## 2018-01-11 DIAGNOSIS — Z1331 Encounter for screening for depression: Secondary | ICD-10-CM | POA: Diagnosis not present

## 2018-01-11 DIAGNOSIS — M5126 Other intervertebral disc displacement, lumbar region: Secondary | ICD-10-CM | POA: Diagnosis not present

## 2018-01-11 DIAGNOSIS — I10 Essential (primary) hypertension: Secondary | ICD-10-CM | POA: Diagnosis not present

## 2018-01-11 DIAGNOSIS — E119 Type 2 diabetes mellitus without complications: Secondary | ICD-10-CM | POA: Diagnosis not present

## 2018-02-04 DIAGNOSIS — E119 Type 2 diabetes mellitus without complications: Secondary | ICD-10-CM | POA: Diagnosis not present

## 2018-02-04 DIAGNOSIS — E663 Overweight: Secondary | ICD-10-CM | POA: Diagnosis not present

## 2018-02-04 DIAGNOSIS — I10 Essential (primary) hypertension: Secondary | ICD-10-CM | POA: Diagnosis not present

## 2018-02-04 DIAGNOSIS — M5126 Other intervertebral disc displacement, lumbar region: Secondary | ICD-10-CM | POA: Diagnosis not present

## 2018-03-07 DIAGNOSIS — M5126 Other intervertebral disc displacement, lumbar region: Secondary | ICD-10-CM | POA: Diagnosis not present

## 2018-03-07 DIAGNOSIS — E119 Type 2 diabetes mellitus without complications: Secondary | ICD-10-CM | POA: Diagnosis not present

## 2018-03-07 DIAGNOSIS — E663 Overweight: Secondary | ICD-10-CM | POA: Diagnosis not present

## 2018-03-07 DIAGNOSIS — Z23 Encounter for immunization: Secondary | ICD-10-CM | POA: Diagnosis not present

## 2018-03-07 DIAGNOSIS — I10 Essential (primary) hypertension: Secondary | ICD-10-CM | POA: Diagnosis not present

## 2018-04-07 ENCOUNTER — Other Ambulatory Visit (HOSPITAL_COMMUNITY): Payer: Self-pay | Admitting: Emergency Medicine

## 2018-04-07 NOTE — Progress Notes (Signed)
L[facility]: [facility]07[facility]18 on chart ; includes Hgba1c, CMP collected on 03-07-18   EKG, CXR 12-23-17 epic

## 2018-04-07 NOTE — Patient Instructions (Signed)
Brandon Knox  04/07/2018   Your procedure is scheduled on: 04-13-18   Report to [facility] Main  Entrance    Report to admitting at 6:00AM    Call this number if you have problems the morning of surgery [phone]     Remember: Do not eat food or drink liquids :After Midnight.     Take these medicines the morning of surgery with A SIP OF WATER: none                                 You may not have any metal on your body including hair pins and              piercings  Do not wear jewelry, make-up, lotions, powders or perfumes, deodorant              Men may shave face and neck.   Do not bring valuables to the hospital. [facility] IS NOT             RESPONSIBLE   FOR VALUABLES.  Contacts, dentures or bridgework may not be worn into surgery.  Leave suitcase in the car. After surgery it may be brought to your room.                 Please read over the following fact sheets you were given: _____________________________________________________________________             How to Manage Your Diabetes Before and After Surgery  Why is it important to control my blood sugar before and after surgery? . Improving blood sugar levels before and after surgery helps healing and can limit problems. . A way of improving blood sugar control is eating a healthy diet by: o  Eating less sugar and carbohydrates o  Increasing activity/exercise o  Talking with your doctor about reaching your blood sugar goals . High blood sugars (greater than 180 mg/dL) can raise your risk of infections and slow your recovery, so you will need to focus on controlling your diabetes during the weeks before surgery. . Make sure that the doctor who takes care of your diabetes knows about your planned surgery including the date and location.  How do I manage my blood sugar before surgery? . Check your blood sugar at least 4 times a day, starting 2 days before surgery, to make sure  that the level is not too high or low. o Check your blood sugar the morning of your surgery when you wake up and every 2 hours until you get to the Short Stay unit. . If your blood sugar is less than 70 mg/dL, you will need to treat for low blood sugar: o Do not take insulin. o Treat a low blood sugar (less than 70 mg/dL) with  cup of clear juice (cranberry or apple), 4 glucose tablets, OR glucose gel. o Recheck blood sugar in 15 minutes after treatment (to make sure it is greater than 70 mg/dL). If your blood sugar is not greater than 70 mg/dL on recheck, call [phone] for further instructions. . Report your blood sugar to the short stay nurse when you get to Short Stay.  . If you are admitted to the hospital after surgery: o Your blood sugar will be checked by the staff and you will probably be given insulin after surgery (  instead of oral diabetes medicines) to make sure you have good blood sugar levels. o The goal for blood sugar control after surgery is 80-180 mg/dL.   WHAT DO I DO ABOUT MY DIABETES MEDICATION?   . THE MORNING OF SURGERY,Do not take oral diabetes medicines (pills)    Patient Signature:  Date:   Nurse Signature:  Date:   Reviewed and Endorsed by [facility] Patient Education Committee, August 2015   [facility] - Preparing for [facility] Before surgery, you can play an important role.  Because skin is not sterile, your skin needs to be as free of germs as possible.  You can reduce the number of germs on your skin by washing with CHG (chlorahexidine gluconate) soap before surgery.  CHG is an antiseptic cleaner which kills germs and bonds with the skin to continue killing germs even after washing. Please DO NOT use if you have an allergy to CHG or antibacterial soaps.  If your skin becomes reddened/irritated stop using the CHG and inform your nurse when you arrive at Short Stay. Do not shave (including legs and underarms) for at least 48 hours prior to the first CHG  shower.  You may shave your face/neck. Please follow these instructions carefully:  1.  Shower with CHG Soap the night before surgery and the  morning of Surgery.  2.  If you choose to wash your hair, wash your hair first as usual with your  normal  shampoo.  3.  After you shampoo, rinse your hair and body thoroughly to remove the  shampoo.                           4.  Use CHG as you would any other liquid soap.  You can apply chg directly  to the skin and wash                       Gently with a scrungie or clean washcloth.  5.  Apply the CHG Soap to your body ONLY FROM THE NECK DOWN.   Do not use on face/ open                           Wound or open sores. Avoid contact with eyes, ears mouth and genitals (private parts).                       Wash face,  Genitals (private parts) with your normal soap.             6.  Wash thoroughly, paying special attention to the area where your surgery  will be performed.  7.  Thoroughly rinse your body with warm water from the neck down.  8.  DO NOT shower/wash with your normal soap after using and rinsing off  the CHG Soap.                9.  Pat yourself dry with a clean towel.            10.  Wear clean pajamas.            11.  Place clean sheets on your bed the night of your first shower and do not  sleep with pets. Day of Surgery : Do not apply any lotions/deodorants the morning of surgery.  Please wear clean clothes to the hospital/surgery center.  FAILURE  TO FOLLOW THESE INSTRUCTIONS MAY RESULT IN THE CANCELLATION OF YOUR SURGERY PATIENT SIGNATURE_________________________________  NURSE SIGNATURE__________________________________  ________________________________________________________________________   Brandon Knox  An incentive spirometer is a tool that can help keep your lungs clear and active. This tool measures how well you are filling your lungs with each breath. Taking long deep breaths may help reverse or decrease the chance  of developing breathing (pulmonary) problems (especially infection) following:  A long period of time when you are unable to move or be active. BEFORE THE PROCEDURE   If the spirometer includes an indicator to show your best effort, your nurse or respiratory therapist will set it to a desired goal.  If possible, sit up straight or lean slightly forward. Try not to slouch.  Hold the incentive spirometer in an upright position. INSTRUCTIONS FOR USE  1. Sit on the edge of your bed if possible, or sit up as far as you can in bed or on a chair. 2. Hold the incentive spirometer in an upright position. 3. Breathe out normally. 4. Place the mouthpiece in your mouth and seal your lips tightly around it. 5. Breathe in slowly and as deeply as possible, raising the piston or the ball toward the top of the column. 6. Hold your breath for 3-5 seconds or for as long as possible. Allow the piston or ball to fall to the bottom of the column. 7. Remove the mouthpiece from your mouth and breathe out normally. 8. Rest for a few seconds and repeat Steps 1 through 7 at least 10 times every 1-2 hours when you are awake. Take your time and take a few normal breaths between deep breaths. 9. The spirometer may include an indicator to show your best effort. Use the indicator as a goal to work toward during each repetition. 10. After each set of 10 deep breaths, practice coughing to be sure your lungs are clear. If you have an incision (the cut made at the time of surgery), support your incision when coughing by placing a pillow or rolled up towels firmly against it. Once you are able to get out of bed, walk around indoors and cough well. You may stop using the incentive spirometer when instructed by your caregiver.  RISKS AND COMPLICATIONS  Take your time so you do not get dizzy or light-headed.  If you are in pain, you may need to take or ask for pain medication before doing incentive spirometry. It is harder to  take a deep breath if you are having pain. AFTER USE  Rest and breathe slowly and easily.  It can be helpful to keep track of a log of your progress. Your caregiver can provide you with a simple table to help with this. If you are using the spirometer at home, follow these instructions: SEEK MEDICAL CARE IF:   You are having difficultly using the spirometer.  You have trouble using the spirometer as often as instructed.  Your pain medication is not giving enough relief while using the spirometer.  You develop fever of 100.5 F (38.1 C) or higher. SEEK IMMEDIATE MEDICAL CARE IF:   You cough up bloody sputum that had not been present before.  You develop fever of 102 F (38.9 C) or greater.  You develop worsening pain at or near the incision site. MAKE SURE YOU:   Understand these instructions.  Will watch your condition.  Will get help right away if you are not doing well or get worse. Document Released: 03/01/2007 Document  Revised: 01/11/2012 Document Reviewed: 05/02/2007 [employer] Patient Information 2014 [place], [place].   ________________________________________________________________________

## 2018-04-08 ENCOUNTER — Other Ambulatory Visit: Payer: Self-pay

## 2018-04-08 ENCOUNTER — Encounter (HOSPITAL_COMMUNITY): Payer: Self-pay

## 2018-04-08 ENCOUNTER — Ambulatory Visit (HOSPITAL_COMMUNITY)
Admission: RE | Admit: 2018-04-08 | Discharge: 2018-04-08 | Disposition: A | Discharge status: Home / Self Care | Payer: Federal, State, Local not specified - PPO | Source: Ambulatory Visit | Attending: Surgical | Admitting: Surgical

## 2018-04-08 ENCOUNTER — Encounter (HOSPITAL_COMMUNITY)
Admission: RE | Admit: 2018-04-08 | Discharge: 2018-04-08 | Disposition: A | Discharge status: Home / Self Care | Payer: Federal, State, Local not specified - PPO | Source: Ambulatory Visit | Attending: Orthopedic Surgery | Admitting: Orthopedic Surgery

## 2018-04-08 DIAGNOSIS — M48061 Spinal stenosis, lumbar region without neurogenic claudication: Secondary | ICD-10-CM | POA: Diagnosis not present

## 2018-04-08 DIAGNOSIS — M545 Low back pain, unspecified: Secondary | ICD-10-CM

## 2018-04-08 DIAGNOSIS — M47816 Spondylosis without myelopathy or radiculopathy, lumbar region: Secondary | ICD-10-CM | POA: Diagnosis not present

## 2018-04-08 DIAGNOSIS — I700 Atherosclerosis of aorta: Secondary | ICD-10-CM | POA: Insufficient documentation

## 2018-04-08 DIAGNOSIS — Z01818 Encounter for other preprocedural examination: Secondary | ICD-10-CM | POA: Diagnosis not present

## 2018-04-08 HISTORY — DX: Type 2 diabetes mellitus without complications: E11.9

## 2018-04-08 LAB — URINALYSIS, ROUTINE W REFLEX MICROSCOPIC
Bilirubin Urine: NEGATIVE
Glucose, UA: >=500 mg/dL — AB
Hgb urine dipstick: NEGATIVE
Ketones, ur: NEGATIVE mg/dL
Leukocytes, UA: NEGATIVE
Nitrite: NEGATIVE
Protein, ur: NEGATIVE mg/dL
Specific Gravity, Urine: 1.007 (ref 1.005–1.030)
pH: 5 (ref 5.0–8.0)

## 2018-04-08 LAB — CBC WITH DIFFERENTIAL/PLATELET
Basophils Absolute: 0 10*3/uL (ref 0.0–0.1)
Basophils Relative: 0 %
Eosinophils Absolute: 0.2 10*3/uL (ref 0.0–0.7)
Eosinophils Relative: 3 %
HCT: 47.6 % (ref 39.0–52.0)
Hemoglobin: 16.3 g/dL (ref 13.0–17.0)
Lymphocytes Relative: 41 %
Lymphs Abs: 2.9 10*3/uL (ref 0.7–4.0)
MCH: 30.5 pg (ref 26.0–34.0)
MCHC: 34.2 g/dL (ref 30.0–36.0)
MCV: 89.1 fL (ref 78.0–100.0)
Monocytes Absolute: 0.5 10*3/uL (ref 0.1–1.0)
Monocytes Relative: 7 %
Neutro Abs: 3.5 10*3/uL (ref 1.7–7.7)
Neutrophils Relative %: 49 %
Platelets: 198 10*3/uL (ref 150–400)
RBC: 5.34 MIL/uL (ref 4.22–5.81)
RDW: 12.3 % (ref 11.5–15.5)
WBC: 7 10*3/uL (ref 4.0–10.5)

## 2018-04-08 LAB — PROTIME-INR
INR: 1.02
Prothrombin Time: 13.3 seconds (ref 11.4–15.2)

## 2018-04-08 LAB — COMPREHENSIVE METABOLIC PANEL
ALT: 19 U/L (ref 17–63)
AST: 17 U/L (ref 15–41)
Albumin: 4.6 g/dL (ref 3.5–5.0)
Alkaline Phosphatase: 56 U/L (ref 38–126)
Anion gap: 9 (ref 5–15)
BUN: 19 mg/dL (ref 6–20)
CO2: 27 mmol/L (ref 22–32)
Calcium: 9.3 mg/dL (ref 8.9–10.3)
Chloride: 103 mmol/L (ref 101–111)
Creatinine, Ser: 0.8 mg/dL (ref 0.61–1.24)
GFR calc Af Amer: >60 mL/min (ref >60)
GFR calc non Af Amer: >60 mL/min (ref >60)
Glucose, Bld: 116 mg/dL — ABNORMAL HIGH (ref 65–99)
Potassium: 3.8 mmol/L (ref 3.5–5.1)
Sodium: 139 mmol/L (ref 135–145)
Total Bilirubin: 1.3 mg/dL — ABNORMAL HIGH (ref 0.3–1.2)
Total Protein: 7.3 g/dL (ref 6.5–8.1)

## 2018-04-08 LAB — GLUCOSE, CAPILLARY: Glucose-Capillary: 110 mg/dL — ABNORMAL HIGH (ref 65–99)

## 2018-04-08 LAB — SURGICAL PCR SCREEN
MRSA, PCR: NEGATIVE
Staphylococcus aureus: NEGATIVE

## 2018-04-08 LAB — APTT: aPTT: 33 seconds (ref 24–36)

## 2018-04-08 NOTE — H&P (Deleted)
TOTAL HIP ADMISSION H&P  Patient is admitted for left total hip arthroplasty.  Subjective:  Chief Complaint: left hip pain  HPI: Brandon Knox, 47 y.o. male, has a history of pain and functional disability in the left hip(s) due to arthritis and patient has failed non-surgical conservative treatments for greater than 12 weeks to include NSAID's and/or analgesics, corticosteriod injections, flexibility and strengthening excercises and activity modification.  Onset of symptoms was gradual starting 8 years ago with gradually worsening course since that time.The patient noted no past surgery on the left hip(s).  Patient currently rates pain in the left hip at 7 out of 10 with activity. Patient has night pain, worsening of pain with activity and weight bearing, pain that interfers with activities of daily living and pain with passive range of motion. Patient has evidence of subchondral cysts, periarticular osteophytes and joint space narrowing by imaging studies. This condition presents safety issues increasing the risk of falls.  There is no current active infection.   Past Medical History:  Diagnosis Date  . Ankle fracture 2011   RIGHT  . Arthritis    ANKLES  . Chronic sinusitis   . Complication of anesthesia 2001   TOLD BY ANESTHESIA AFTER 2001 SURGERY HAS SLEEP APNEA  . Diabetes mellitus without complication (HCC)    type 2 dm   . Hypertension    STOPPED HTN MEDS 2011 DUE TO COST  . Leg fracture, left 2011  . 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 Sleep apnea    TOLD AFTER 2001 SURGERY HAD SLEEP APNEA 45 LBS LIGHT NOW    Past Surgical History:  Procedure Laterality Date  . CYST REMOVED   2001   GANGLION CYST FROM RIGHT GREAT TOE  . FOREGIN BODY REMOVAL  22 YRS AGO   RIGHT FORE ARM  . SURGERY LEFT LEG AND RIGHT ANKLE  2011   PLATES IN BOTH     Current Outpatient Medications  Medication Sig Dispense Refill Last Dose  . glipiZIDE (GLUCOTROL) 5 MG tablet Take 5 mg by  mouth 2 (two) times daily.  2   . SYNJARDY 12.5-500 MG TABS Take 1 tablet by mouth 2 (two) times daily with a meal.  4    No Known Allergies  Social History   Tobacco Use  . Smoking status: Former Smoker    Packs/day: 1.00    Years: 17.00    Pack years: 17.00    Types: Cigarettes  . Smokeless tobacco: Former NeurosurgeonUser    Types: Chew  . Tobacco comment: QUIT 2005  Substance Use Topics  . Alcohol use: Not Currently    Frequency: Never     Review of Systems  Constitutional: Negative.   HENT: Negative.   Eyes: Negative.   Respiratory: Negative.   Cardiovascular: Negative.   Gastrointestinal: Negative.   Genitourinary: Negative.   Musculoskeletal: Positive for joint pain and myalgias. Negative for back pain, falls and neck pain.  Skin: Negative.   Neurological: Negative.   Endo/Heme/Allergies: Negative.   Psychiatric/Behavioral: Negative.     Objective:  Physical Exam  Constitutional: He is oriented to person, place, and time. He appears well-developed and well-nourished. No distress.  HENT:  Head: Normocephalic and atraumatic.  Right Ear: External ear normal.  Left Ear: External ear normal.  Nose: Nose normal.  Mouth/Throat: Oropharynx is clear and moist.  Eyes: Conjunctivae and EOM are normal.  Neck: Normal range of motion. Neck supple.  Cardiovascular: Normal rate,  regular rhythm, normal heart sounds and intact distal pulses.  No murmur heard. Respiratory: Effort normal and breath sounds normal. No respiratory distress. He has no wheezes.  GI: Soft. Bowel sounds are normal. He exhibits no distension. There is no tenderness.  Musculoskeletal:  Right Hip Exam: ROM: Normal without discomfort.  There is no tenderness over the greater trochanter.  There is no pain on provocative testing of the hip.  Antalgic gait pattern on the left.   Left Hip Exam: ROM: Flexion to 90 degrees, No internal rotation, external rotation 20 degrees, and abduction 20 degrees.  There is no  tenderness over the greater trochanter.  There is no pain on provocative testing of the hip.  Neurological: He is alert and oriented to person, place, and time. He has normal strength and normal reflexes. No sensory deficit.  Skin: No rash noted. He is not diaphoretic. No erythema.  Psychiatric: He has a normal mood and affect. His behavior is normal.    Vital signs in last 24 hours: Temp:  [97.7 F (36.5 C)] 97.7 F (36.5 C) (06/07 0808) Pulse Rate:  [78] 78 (06/07 0808) Resp:  [18] 18 (06/07 0808) BP: (154-155)/(112-113) 155/113 (06/07 0845) SpO2:  [100 %] 100 % (06/07 0808) Weight:  [88 kg (194 lb)] 88 kg (194 lb) (06/07 1610)    Imaging Review Plain radiographs demonstrate severe degenerative joint disease of the left hip(s). The bone quality appears to be good for age and reported activity level.    Preoperative templating of the joint replacement has been completed, documented, and submitted to the Operating Room personnel in order to optimize intra-operative equipment management.     Assessment/Plan:  End stage primary osteoarthritis, left hip(s)  The patient history, physical examination, clinical judgement of the provider and imaging studies are consistent with end stage degenerative joint disease of the left hip(s) and total hip arthroplasty is deemed medically necessary. The treatment options including medical management, injection therapy, arthroscopy and arthroplasty were discussed at length. The risks and benefits of total hip arthroplasty were presented and reviewed. The risks due to aseptic loosening, infection, stiffness, dislocation/subluxation,  thromboembolic complications and other imponderables were discussed.  The patient acknowledged the explanation, agreed to proceed with the plan and consent was signed. Patient is being admitted for inpatient treatment for surgery, pain control, PT, OT, prophylactic antibiotics, VTE prophylaxis, progressive ambulation and  ADL's and discharge planning.The patient is planning to be discharged home.   Therapy Plans: HEP Disposition: Home with stepdaughter Planned DVT prophylaxis: aspirin 325mg  BID DME needed: may need walker; will try to borrow one PCP: Amy Moon Other: TX[facility] IV  Dimitri Ped, PA-C

## 2018-04-12 MED ORDER — BUPIVACAINE LIPOSOME 1.3 % IJ SUSP
20.0000 mL | INTRAMUSCULAR | Status: DC
  Filled 2018-04-12: qty 20

## 2018-04-12 NOTE — Anesthesia Preprocedure Evaluation (Addendum)
Anesthesia Evaluation  Patient identified by MRN, date of birth, ID band Patient awake    Reviewed: Allergy & Precautions, NPO status , Patient's Chart, lab work & pertinent test results  Airway Mallampati: I  TM Distance: >3 FB Neck ROM: Full    Dental no notable dental hx. (+) Dental Advisory Given, Teeth Intact   Pulmonary former smoker,    Pulmonary exam normal breath sounds clear to auscultation       Cardiovascular hypertension, negative cardio ROS Normal cardiovascular exam Rhythm:Regular Rate:Normal     Neuro/Psych negative neurological ROS  negative psych ROS   GI/Hepatic negative GI ROS, Neg liver ROS,   Endo/Other  negative endocrine ROSdiabetes  Renal/GU negative Renal ROS  negative genitourinary   Musculoskeletal negative musculoskeletal ROS (+)   Abdominal   Peds negative pediatric ROS (+)  Hematology negative hematology ROS (+)   Anesthesia Other Findings   Reproductive/Obstetrics negative OB ROS                            Anesthesia Physical Anesthesia Plan  ASA: III  Anesthesia Plan: General   Post-op Pain Management:    Induction: Intravenous  PONV Risk Score and Plan: Treatment may vary due to age or medical condition  Airway Management Planned: Oral ETT  Additional Equipment:   Intra-op Plan:   Post-operative Plan: Extubation in OR  Informed Consent: I have reviewed the patients History and Physical, chart, labs and discussed the procedure including the risks, benefits and alternatives for the proposed anesthesia with the patient or authorized representative who has indicated his/her understanding and acceptance.   Dental advisory given  Plan Discussed with: CRNA and Anesthesiologist  Anesthesia Plan Comments:         Anesthesia Quick Evaluation

## 2018-04-13 ENCOUNTER — Other Ambulatory Visit: Payer: Self-pay

## 2018-04-13 ENCOUNTER — Observation Stay (HOSPITAL_COMMUNITY)
Admission: RE | Admit: 2018-04-13 | Discharge: 2018-04-14 | Disposition: A | Discharge status: Home / Self Care | Payer: Federal, State, Local not specified - PPO | Source: Ambulatory Visit | Attending: Orthopedic Surgery | Admitting: Orthopedic Surgery

## 2018-04-13 ENCOUNTER — Encounter (HOSPITAL_COMMUNITY): Payer: Self-pay | Admitting: *Deleted

## 2018-04-13 ENCOUNTER — Ambulatory Visit (HOSPITAL_COMMUNITY): Payer: Federal, State, Local not specified - PPO

## 2018-04-13 ENCOUNTER — Ambulatory Visit (HOSPITAL_COMMUNITY): Payer: Federal, State, Local not specified - PPO | Admitting: Anesthesiology

## 2018-04-13 ENCOUNTER — Encounter (HOSPITAL_COMMUNITY)
Admission: RE | Disposition: A | Discharge status: Home / Self Care | Payer: Self-pay | Source: Ambulatory Visit | Attending: Orthopedic Surgery

## 2018-04-13 DIAGNOSIS — Z981 Arthrodesis status: Secondary | ICD-10-CM | POA: Diagnosis not present

## 2018-04-13 DIAGNOSIS — Z7982 Long term (current) use of aspirin: Secondary | ICD-10-CM | POA: Diagnosis not present

## 2018-04-13 DIAGNOSIS — M19072 Primary osteoarthritis, left ankle and foot: Secondary | ICD-10-CM | POA: Diagnosis not present

## 2018-04-13 DIAGNOSIS — Z79899 Other long term (current) drug therapy: Secondary | ICD-10-CM | POA: Insufficient documentation

## 2018-04-13 DIAGNOSIS — M48062 Spinal stenosis, lumbar region with neurogenic claudication: Secondary | ICD-10-CM | POA: Diagnosis present

## 2018-04-13 DIAGNOSIS — M5127 Other intervertebral disc displacement, lumbosacral region: Secondary | ICD-10-CM | POA: Diagnosis not present

## 2018-04-13 DIAGNOSIS — Z7984 Long term (current) use of oral hypoglycemic drugs: Secondary | ICD-10-CM | POA: Diagnosis not present

## 2018-04-13 DIAGNOSIS — M4807 Spinal stenosis, lumbosacral region: Secondary | ICD-10-CM | POA: Insufficient documentation

## 2018-04-13 DIAGNOSIS — E119 Type 2 diabetes mellitus without complications: Secondary | ICD-10-CM | POA: Insufficient documentation

## 2018-04-13 DIAGNOSIS — Z91120 Patient's intentional underdosing of medication regimen due to financial hardship: Secondary | ICD-10-CM | POA: Diagnosis not present

## 2018-04-13 DIAGNOSIS — M48061 Spinal stenosis, lumbar region without neurogenic claudication: Secondary | ICD-10-CM | POA: Diagnosis not present

## 2018-04-13 DIAGNOSIS — I10 Essential (primary) hypertension: Secondary | ICD-10-CM | POA: Diagnosis not present

## 2018-04-13 DIAGNOSIS — Z419 Encounter for procedure for purposes other than remedying health state, unspecified: Secondary | ICD-10-CM

## 2018-04-13 DIAGNOSIS — M5126 Other intervertebral disc displacement, lumbar region: Secondary | ICD-10-CM | POA: Diagnosis not present

## 2018-04-13 DIAGNOSIS — M19071 Primary osteoarthritis, right ankle and foot: Secondary | ICD-10-CM | POA: Diagnosis not present

## 2018-04-13 DIAGNOSIS — Z87891 Personal history of nicotine dependence: Secondary | ICD-10-CM | POA: Insufficient documentation

## 2018-04-13 HISTORY — PX: LUMBAR LAMINECTOMY/DECOMPRESSION MICRODISCECTOMY: SHX5026

## 2018-04-13 LAB — TYPE AND SCREEN
ABO/RH(D): A NEG
Antibody Screen: NEGATIVE

## 2018-04-13 LAB — GLUCOSE, CAPILLARY
Glucose-Capillary: 126 mg/dL — ABNORMAL HIGH (ref 65–99)
Glucose-Capillary: 166 mg/dL — ABNORMAL HIGH (ref 65–99)
Glucose-Capillary: 143 mg/dL — ABNORMAL HIGH (ref 65–99)
Glucose-Capillary: 142 mg/dL — ABNORMAL HIGH (ref 65–99)
Glucose-Capillary: 170 mg/dL — ABNORMAL HIGH (ref 65–99)

## 2018-04-13 SURGERY — LUMBAR LAMINECTOMY/DECOMPRESSION MICRODISCECTOMY
Anesthesia: General | Site: Back | Laterality: Right

## 2018-04-13 MED ORDER — HYDROCODONE-ACETAMINOPHEN 5-325 MG PO TABS
1.0000 | ORAL_TABLET | ORAL | Status: DC | PRN
  Administered 2018-04-13 (×2): 1 via ORAL
  Filled 2018-04-13 (×2): qty 1

## 2018-04-13 MED ORDER — DEXAMETHASONE SODIUM PHOSPHATE 4 MG/ML IJ SOLN
INTRAMUSCULAR | Status: DC | PRN
  Administered 2018-04-13: 4 mg via INTRAVENOUS

## 2018-04-13 MED ORDER — METOCLOPRAMIDE HCL 5 MG/ML IJ SOLN
INTRAMUSCULAR | Status: AC
  Filled 2018-04-13: qty 2

## 2018-04-13 MED ORDER — ACETAMINOPHEN 500 MG PO TABS
1000.0000 mg | ORAL_TABLET | Freq: Once | ORAL | Status: AC
  Administered 2018-04-13: 1000 mg via ORAL
  Filled 2018-04-13: qty 2

## 2018-04-13 MED ORDER — SODIUM CHLORIDE 0.9 % IV SOLN
INTRAVENOUS | Status: AC
  Filled 2018-04-13: qty 500000

## 2018-04-13 MED ORDER — HYDROCODONE-ACETAMINOPHEN 7.5-325 MG PO TABS: 1.0000 | ORAL_TABLET | Freq: Once | ORAL | Status: DC | PRN

## 2018-04-13 MED ORDER — ONDANSETRON HCL 4 MG/2ML IJ SOLN: 4.0000 mg | Freq: Four times a day (QID) | INTRAMUSCULAR | Status: DC | PRN

## 2018-04-13 MED ORDER — LACTATED RINGERS IV SOLN
INTRAVENOUS | Status: DC
  Administered 2018-04-13: 08:00:00 via INTRAVENOUS

## 2018-04-13 MED ORDER — SODIUM CHLORIDE 0.9 % IV SOLN
INTRAVENOUS | Status: DC | PRN
  Administered 2018-04-13: 500 mL

## 2018-04-13 MED ORDER — HYDROMORPHONE HCL 1 MG/ML IJ SOLN: 0.2500 mg | INTRAMUSCULAR | Status: DC | PRN

## 2018-04-13 MED ORDER — SUGAMMADEX SODIUM 200 MG/2ML IV SOLN
INTRAVENOUS | Status: AC
  Filled 2018-04-13: qty 2

## 2018-04-13 MED ORDER — PHENYLEPHRINE 40 MCG/ML (10ML) SYRINGE FOR IV PUSH (FOR BLOOD PRESSURE SUPPORT)
PREFILLED_SYRINGE | INTRAVENOUS | Status: DC | PRN
  Administered 2018-04-13 (×2): 40 ug via INTRAVENOUS

## 2018-04-13 MED ORDER — ROCURONIUM BROMIDE 10 MG/ML (PF) SYRINGE
PREFILLED_SYRINGE | INTRAVENOUS | Status: AC
  Filled 2018-04-13: qty 5

## 2018-04-13 MED ORDER — PROPOFOL 10 MG/ML IV BOLUS
INTRAVENOUS | Status: DC | PRN
  Administered 2018-04-13: 150 mg via INTRAVENOUS

## 2018-04-13 MED ORDER — CLONIDINE HCL 0.2 MG PO TABS
0.2000 mg | ORAL_TABLET | Freq: Once | ORAL | Status: AC
  Administered 2018-04-13: 0.2 mg via ORAL
  Filled 2018-04-13 (×2): qty 1

## 2018-04-13 MED ORDER — METOCLOPRAMIDE HCL 5 MG/ML IJ SOLN
INTRAMUSCULAR | Status: DC | PRN
  Administered 2018-04-13: 10 mg via INTRAVENOUS

## 2018-04-13 MED ORDER — BUPIVACAINE LIPOSOME 1.3 % IJ SUSP
INTRAMUSCULAR | Status: DC | PRN
  Administered 2018-04-13: 20 mL

## 2018-04-13 MED ORDER — PROPOFOL 10 MG/ML IV BOLUS
INTRAVENOUS | Status: AC
  Filled 2018-04-13: qty 20

## 2018-04-13 MED ORDER — MIDAZOLAM HCL 5 MG/5ML IJ SOLN
INTRAMUSCULAR | Status: DC | PRN
  Administered 2018-04-13: 2 mg via INTRAVENOUS

## 2018-04-13 MED ORDER — LIDOCAINE 2% (20 MG/ML) 5 ML SYRINGE
INTRAMUSCULAR | Status: AC
  Filled 2018-04-13: qty 5

## 2018-04-13 MED ORDER — BACITRACIN-NEOMYCIN-POLYMYXIN 400-5-5000 EX OINT: TOPICAL_OINTMENT | CUTANEOUS | Status: DC | PRN

## 2018-04-13 MED ORDER — ONDANSETRON HCL 4 MG PO TABS: 4.0000 mg | ORAL_TABLET | Freq: Four times a day (QID) | ORAL | Status: DC | PRN

## 2018-04-13 MED ORDER — INSULIN ASPART 100 UNIT/ML ~~LOC~~ SOLN
0.0000 [IU] | Freq: Three times a day (TID) | SUBCUTANEOUS | Status: DC
Start: 2018-04-13 — End: 2018-04-14
  Administered 2018-04-13 – 2018-04-14 (×3): 2 [IU] via SUBCUTANEOUS

## 2018-04-13 MED ORDER — MEPERIDINE HCL 50 MG/ML IJ SOLN: 6.2500 mg | INTRAMUSCULAR | Status: DC | PRN

## 2018-04-13 MED ORDER — POLYETHYLENE GLYCOL 3350 17 G PO PACK: 17.0000 g | PACK | Freq: Every day | ORAL | Status: DC | PRN

## 2018-04-13 MED ORDER — LIDOCAINE 2% (20 MG/ML) 5 ML SYRINGE
INTRAMUSCULAR | Status: DC | PRN
  Administered 2018-04-13: 100 mg via INTRAVENOUS

## 2018-04-13 MED ORDER — SUGAMMADEX SODIUM 200 MG/2ML IV SOLN
INTRAVENOUS | Status: DC | PRN
  Administered 2018-04-13: 200 mg via INTRAVENOUS

## 2018-04-13 MED ORDER — CEFAZOLIN SODIUM-DEXTROSE 2-4 GM/100ML-% IV SOLN
2.0000 g | INTRAVENOUS | Status: AC
  Administered 2018-04-13: 2 g via INTRAVENOUS
  Filled 2018-04-13: qty 100

## 2018-04-13 MED ORDER — ONDANSETRON HCL 4 MG/2ML IJ SOLN
INTRAMUSCULAR | Status: DC | PRN
  Administered 2018-04-13: 4 mg via INTRAVENOUS

## 2018-04-13 MED ORDER — ACETAMINOPHEN 325 MG PO TABS
650.0000 mg | ORAL_TABLET | ORAL | Status: DC | PRN
  Administered 2018-04-13 – 2018-04-14 (×2): 650 mg via ORAL
  Filled 2018-04-13 (×2): qty 2

## 2018-04-13 MED ORDER — THROMBIN (RECOMBINANT) 5000 UNITS EX SOLR
CUTANEOUS | Status: AC
  Filled 2018-04-13: qty 10000

## 2018-04-13 MED ORDER — FLEET ENEMA 7-19 GM/118ML RE ENEM: 1.0000 | ENEMA | Freq: Once | RECTAL | Status: DC | PRN

## 2018-04-13 MED ORDER — MIDAZOLAM HCL 2 MG/2ML IJ SOLN
INTRAMUSCULAR | Status: AC
  Filled 2018-04-13: qty 2

## 2018-04-13 MED ORDER — ACETAMINOPHEN 10 MG/ML IV SOLN: 1000.0000 mg | Freq: Once | INTRAVENOUS | Status: DC | PRN

## 2018-04-13 MED ORDER — CHLORHEXIDINE GLUCONATE 4 % EX LIQD: 60.0000 mL | Freq: Once | CUTANEOUS | Status: DC

## 2018-04-13 MED ORDER — CEFAZOLIN SODIUM-DEXTROSE 1-4 GM/50ML-% IV SOLN
1.0000 g | Freq: Three times a day (TID) | INTRAVENOUS | Status: AC
  Administered 2018-04-13 – 2018-04-14 (×3): 1 g via INTRAVENOUS
  Filled 2018-04-13 (×4): qty 50

## 2018-04-13 MED ORDER — DEXAMETHASONE SODIUM PHOSPHATE 10 MG/ML IJ SOLN
INTRAMUSCULAR | Status: AC
  Filled 2018-04-13: qty 1

## 2018-04-13 MED ORDER — CANAGLIFLOZIN 100 MG PO TABS
100.0000 mg | ORAL_TABLET | Freq: Every day | ORAL | Status: DC
  Filled 2018-04-13: qty 1

## 2018-04-13 MED ORDER — HYDROMORPHONE HCL 1 MG/ML IJ SOLN: 0.5000 mg | INTRAMUSCULAR | Status: DC | PRN

## 2018-04-13 MED ORDER — ACETAMINOPHEN 650 MG RE SUPP: 650.0000 mg | RECTAL | Status: DC | PRN

## 2018-04-13 MED ORDER — PROMETHAZINE HCL 25 MG/ML IJ SOLN: 6.2500 mg | INTRAMUSCULAR | Status: DC | PRN

## 2018-04-13 MED ORDER — ONDANSETRON HCL 4 MG/2ML IJ SOLN
INTRAMUSCULAR | Status: AC
  Filled 2018-04-13: qty 2

## 2018-04-13 MED ORDER — EMPAGLIFLOZIN-METFORMIN HCL 12.5-500 MG PO TABS: 1.0000 | ORAL_TABLET | Freq: Two times a day (BID) | ORAL | Status: DC

## 2018-04-13 MED ORDER — PHENOL 1.4 % MT LIQD: 1.0000 | OROMUCOSAL | Status: DC | PRN

## 2018-04-13 MED ORDER — GABAPENTIN 300 MG PO CAPS
300.0000 mg | ORAL_CAPSULE | Freq: Once | ORAL | Status: AC
  Administered 2018-04-13: 300 mg via ORAL
  Filled 2018-04-13: qty 1

## 2018-04-13 MED ORDER — PHENYLEPHRINE 40 MCG/ML (10ML) SYRINGE FOR IV PUSH (FOR BLOOD PRESSURE SUPPORT)
PREFILLED_SYRINGE | INTRAVENOUS | Status: AC
  Filled 2018-04-13: qty 10

## 2018-04-13 MED ORDER — FENTANYL CITRATE (PF) 250 MCG/5ML IJ SOLN
INTRAMUSCULAR | Status: AC
  Filled 2018-04-13: qty 5

## 2018-04-13 MED ORDER — BISACODYL 5 MG PO TBEC: 5.0000 mg | DELAYED_RELEASE_TABLET | Freq: Every day | ORAL | Status: DC | PRN

## 2018-04-13 MED ORDER — BUPIVACAINE-EPINEPHRINE (PF) 0.5% -1:200000 IJ SOLN
INTRAMUSCULAR | Status: AC
  Filled 2018-04-13: qty 30

## 2018-04-13 MED ORDER — HYDROCODONE-ACETAMINOPHEN 10-325 MG PO TABS: 2.0000 | ORAL_TABLET | ORAL | Status: DC | PRN

## 2018-04-13 MED ORDER — MENTHOL 3 MG MT LOZG: 1.0000 | LOZENGE | OROMUCOSAL | Status: DC | PRN

## 2018-04-13 MED ORDER — BACITRACIN-NEOMYCIN-POLYMYXIN 400-5-5000 EX OINT
TOPICAL_OINTMENT | CUTANEOUS | Status: AC
  Filled 2018-04-13: qty 1

## 2018-04-13 MED ORDER — METFORMIN HCL 500 MG PO TABS
500.0000 mg | ORAL_TABLET | Freq: Two times a day (BID) | ORAL | Status: DC
  Administered 2018-04-13 – 2018-04-14 (×2): 500 mg via ORAL
  Filled 2018-04-13 (×2): qty 1

## 2018-04-13 MED ORDER — BUPIVACAINE-EPINEPHRINE (PF) 0.5% -1:200000 IJ SOLN
INTRAMUSCULAR | Status: DC | PRN
  Administered 2018-04-13: 20 mL

## 2018-04-13 MED ORDER — ROCURONIUM BROMIDE 50 MG/5ML IV SOSY
PREFILLED_SYRINGE | INTRAVENOUS | Status: DC | PRN
  Administered 2018-04-13: 50 mg via INTRAVENOUS
  Administered 2018-04-13 (×2): 10 mg via INTRAVENOUS

## 2018-04-13 MED ORDER — FENTANYL CITRATE (PF) 100 MCG/2ML IJ SOLN
INTRAMUSCULAR | Status: DC | PRN
  Administered 2018-04-13 (×5): 50 ug via INTRAVENOUS

## 2018-04-13 MED ORDER — LACTATED RINGERS IV SOLN
INTRAVENOUS | Status: DC
  Administered 2018-04-13: 11:00:00 via INTRAVENOUS

## 2018-04-13 SURGICAL SUPPLY — 49 items
AGENT HMST SPONGE THK3/8 (HEMOSTASIS) ×1
BAG SPEC THK2 15X12 ZIP CLS (MISCELLANEOUS)
BAG ZIPLOCK 12X15 (MISCELLANEOUS) IMPLANT
CLEANER TIP ELECTROSURG 2X2 (MISCELLANEOUS) ×2 IMPLANT
CONT SPEC 4OZ CLIKSEAL STRL BL (MISCELLANEOUS) ×1 IMPLANT
COVER SURGICAL LIGHT HANDLE (MISCELLANEOUS) ×2 IMPLANT
DRAIN PENROSE 18X1/4 LTX STRL (WOUND CARE) IMPLANT
DRAPE MICROSCOPE LEICA (MISCELLANEOUS) ×2 IMPLANT
DRAPE POUCH INSTRU U-SHP 10X18 (DRAPES) ×2 IMPLANT
DRAPE SHEET LG 3/4 BI-LAMINATE (DRAPES) ×2 IMPLANT
DRAPE SURG 17X11 SM STRL (DRAPES) ×2 IMPLANT
DRSG ADAPTIC 3X8 NADH LF (GAUZE/BANDAGES/DRESSINGS) ×2 IMPLANT
DRSG PAD ABDOMINAL 8X10 ST (GAUZE/BANDAGES/DRESSINGS) ×6 IMPLANT
DURAPREP 26ML APPLICATOR (WOUND CARE) ×2 IMPLANT
ELECT BLADE TIP CTD 4 INCH (ELECTRODE) ×2 IMPLANT
ELECT REM PT RETURN 15FT ADLT (MISCELLANEOUS) ×2 IMPLANT
GAUZE SPONGE 4X4 12PLY STRL (GAUZE/BANDAGES/DRESSINGS) ×2 IMPLANT
GLOVE BIOGEL PI IND STRL 6.5 (GLOVE) ×1 IMPLANT
GLOVE BIOGEL PI IND STRL 8.5 (GLOVE) ×1 IMPLANT
GLOVE BIOGEL PI INDICATOR 6.5 (GLOVE) ×1
GLOVE BIOGEL PI INDICATOR 8.5 (GLOVE) ×1
GLOVE ECLIPSE 8.0 STRL XLNG CF (GLOVE) ×4 IMPLANT
GLOVE SURG SS PI 6.5 STRL IVOR (GLOVE) ×2 IMPLANT
GOWN STRL REUS W/ TWL LRG LVL3 (GOWN DISPOSABLE) ×1 IMPLANT
GOWN STRL REUS W/TWL LRG LVL3 (GOWN DISPOSABLE) ×2
GOWN STRL REUS W/TWL XL LVL3 (GOWN DISPOSABLE) ×4 IMPLANT
HEMOSTAT SPONGE AVITENE ULTRA (HEMOSTASIS) ×2 IMPLANT
KIT BASIN OR (CUSTOM PROCEDURE TRAY) ×2 IMPLANT
KIT POSITIONING SURG ANDREWS (MISCELLANEOUS) ×2 IMPLANT
MANIFOLD NEPTUNE II (INSTRUMENTS) ×2 IMPLANT
MARKER SKIN DUAL TIP RULER LAB (MISCELLANEOUS) ×2 IMPLANT
NDL SPNL 18GX3.5 QUINCKE PK (NEEDLE) ×2 IMPLANT
NEEDLE HYPO 22GX1.5 SAFETY (NEEDLE) ×4 IMPLANT
NEEDLE SPNL 18GX3.5 QUINCKE PK (NEEDLE) ×6 IMPLANT
PACK LAMINECTOMY ORTHO (CUSTOM PROCEDURE TRAY) ×2 IMPLANT
PATTIES SURGICAL .5 X.5 (GAUZE/BANDAGES/DRESSINGS) IMPLANT
PATTIES SURGICAL .75X.75 (GAUZE/BANDAGES/DRESSINGS) ×3 IMPLANT
PATTIES SURGICAL 1X1 (DISPOSABLE) IMPLANT
PIN SAFETY NICK PLATE  2 MED (MISCELLANEOUS)
PIN SAFETY NICK PLATE 2 MED (MISCELLANEOUS) IMPLANT
SPONGE LAP 4X18 RFD (DISPOSABLE) ×4 IMPLANT
STAPLER VISISTAT 35W (STAPLE) ×2 IMPLANT
STRIP CLOSURE SKIN 1/2X4 (GAUZE/BANDAGES/DRESSINGS) ×1 IMPLANT
SUT VIC AB 1 CT1 27 (SUTURE) ×4
SUT VIC AB 1 CT1 27XBRD ANTBC (SUTURE) ×2 IMPLANT
SUT VIC AB 2-0 CT1 27 (SUTURE) ×4
SUT VIC AB 2-0 CT1 TAPERPNT 27 (SUTURE) ×2 IMPLANT
SYR 20CC LL (SYRINGE) ×4 IMPLANT
TOWEL OR 17X26 10 PK STRL BLUE (TOWEL DISPOSABLE) ×3 IMPLANT

## 2018-04-13 NOTE — Brief Op Note (Signed)
04/13/2018  10:24 AM  PATIENT:  Brandon Knox  47 y.o. 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 Foraminal Stenosis involving the L-5 and S-1 Nerve Roots.on the Right.  POST-OPERATIVE DIAGNOSIS: Same as Pre-Op.  PROCEDURE:  Procedure(s): Decompression lumbar laminectomy L5-S1 right (Right)for Spinal Stenosis Microdiscectomy at L-5-S-1.Foraminotomies for the L-5 and S-1 Nerve Roots on the Right.  SURGEON:  Surgeon(s) and Role:    * Ranee GosselinGioffre, Chia Mowers, MD - Primary  PHYSICIAN ASSISTANT: Dimitri PedAmber Constable PA  ASSISTANTS: Dimitri PedAmber Constable PA ANESTHESIA:   General  EBL:  25 mL   BLOOD ADMINISTERED:none  DRAINS: none   LOCAL MEDICATIONS USED:  MARCAINE 20cc of 0.50% with Epinephrine  at the start of the case and 2occ of Exparel at the end of the case.    SPECIMEN:  No Specimen  DISPOSITION OF SPECIMEN:  N/A  COUNTS:  YES  TOURNIQUET:  * No tourniquets in log *  DICTATION: .Other Dictation: Dictation Number [id number]  PLAN OF CARE: Admit for overnight observation  PATIENT DISPOSITION:  PACU - hemodynamically stable.   Delay start of Pharmacological VTE agent (>24hrs) due to surgical blood loss or risk of bleeding: yes

## 2018-04-13 NOTE — Op Note (Signed)
NAME: Brandon Knox, Brandon Knox ACCOUNT [id number].:[id number] DATE OF BIRTH:June 30, 1971 FACILITY: [facility] LOCATION: [facility] PHYSICIAN:Linde Wilensky Sanjuana KavaA. Ceairra Mccarver, MD  OPERATIVE REPORT  DATE OF PROCEDURE:  04/13/2018  SURGEON:  Ranee Gosselinonald Ilithyia Titzer, MD  ASSISTANT:  Dimitri PedAmber Constable PA.  PREOPERATIVE DIAGNOSES: 1.  Severe lateral recess stenosis at L5-S1 on the right. 2.  Foraminal stenosis involving the L5 root and the S1 root on the right. 3.  Herniated lumbar disk at L5-S1 on the right.  Note all his symptoms were on the right up until a few days ago, he developed some pain in regards to the left lower extremity.  POSTOPERATIVE DIAGNOSES:   1.  Severe lateral recess stenosis at L5-S1 on the right. 2.  Foraminal stenosis involving the L5 root and the S1 root on the right. 3.  Herniated lumbar disk at L5-S1 on the right.  Note all his symptoms were on the right up until a few days ago, he developed some pain in regards to the left lower extremity.  OPERATION:   1.  Central decompressive lumbar laminectomy for spinal stenosis at L5-S1. 2.  Microdiskectomy at L5-S1 on the right herniated disk. 3.  Foraminal stenosis to the S1 root on the right. 4.  Foraminal decompression L5 and S1 root on the right.  DESCRIPTION OF PROCEDURE:  Under general anesthesia, the patient on the spinal frame.  A routine orthopedic prepping and draping of the lower back was carried out.  Appropriate timeout was carried out.  I also marked the appropriate right side of his  back preop.  At this time, the patient had 2 grams of IV Ancef.  After the timeout and a sterile prep and draping, 2 needles were placed on the back for localization purposes.  X-ray was taken to verify the position.  At this time, an incision was made  over the L5-S1 space.  The bleeders were identified and cauterized.  The incision was extended proximally and distally.  I then inserted self-retaining retractors.  I then separated the muscle from the  lamina and spinous processes bilaterally.  A Kocher  clamp was placed on the spinous process of L5 and another x-ray was taken and another instrument was placed at the L5-S1 interspace to verify the position.  At this time, the self-retaining McCullough retractors were inserted.  I then carried out a  central decompressive lumbar laminectomy by removing a portion of the L5 spinous process.  A portion of the S1 spinous process.  I then went down and brought the microscope in at this point.  We completed the laminectomies bilaterally.  We preserved the  facets.  I then removed the ligamentum flavum, which was quite thick and quite tight.  The underlying dura was protected with a cottonoid as I dissected that free.  Following that, we noted that the lateral recess was extremely tight.  The nerve root at  the S1 root was somewhat edematous.  We took a great deal of time to decompress the lateral recess, utilized the bipolar.  He had multiple veins in that area.  We cauterized those nicely and exposed the disk.  The needle was placed in the disk space in  the usual fashion.  An x-ray was taken.  I then made a cruciate incision in the posterior longitudinal ligament and did a microdiskectomy.  At the end of the procedure we went out with the Epstein curettes laterally and medially and above and below to  make sure that we had no further compression.  The nerve root was now free.  I would like to reemphasize that we did a nice foraminotomy as well for the S1 root, went up to the L5 root as well.  We especially had to decompress that foramen for the S1  root.  Because of the nerve root being so edematous.  After this was done, we were able to easily pass a hockey stick out the foramen.  We thoroughly irrigated out the area.  I loosely applied some Gelfoam.  I then closed the wound in layers in usual  fashion.  I left a small distal deep and proximal part open for drainage purposes in order to avoid any compression  of the dura.  The remaining part of the wound was closed in the usual fashion after we injected 20 mL of Exparel.  The skin was closed  with metal staples.  Note that the beginning of the procedure, I injected 20 mL of 0.5% Marcaine with epinephrine into the soft tissue to prevent any bleeding.  Sterile dressings were applied.  The patient left the operating room in satisfactory  condition.  Once again, he had 2 grams IV Ancef preop.  AN/NUANCE  D:04/13/2018 T:04/13/2018 JOB:[id number]

## 2018-04-13 NOTE — H&P (View-Only) (Signed)
Reason for Consult:Back pain and Right Leg pain Referring Physician: Dr.Donelle Baba  Brandon Knox is an 47 y.o. male.  HPI: Patient developed right lower leg pain with numbness.and now some left leg pain.  Past Medical History:  Diagnosis Date  . Ankle fracture 2011   RIGHT  . Arthritis    ANKLES  . Chronic sinusitis   . Complication of anesthesia 2001   TOLD BY ANESTHESIA AFTER 2001 SURGERY HAS SLEEP APNEA  . Diabetes mellitus without complication (HCC)    type 2 dm   . Hypertension    STOPPED HTN MEDS 2011 DUE TO COST  . Leg fracture, left 2011  . 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 Sleep apnea    TOLD AFTER 2001 SURGERY HAD SLEEP APNEA 45 LBS LIGHT NOW    Past Surgical History:  Procedure Laterality Date  . CYST REMOVED   2001   GANGLION CYST FROM RIGHT GREAT TOE  . FOREGIN BODY REMOVAL  22 YRS AGO   RIGHT FORE ARM  . SURGERY LEFT LEG AND RIGHT ANKLE  2011   PLATES IN BOTH    History reviewed. No pertinent family history.  Social History:  reports that he has quit smoking. His smoking use included cigarettes. He has a 17.00 pack-year smoking history. He has quit using smokeless tobacco. His smokeless tobacco use included chew. He reports that he drank alcohol. He reports that he does not use drugs.  Allergies: No Known Allergies  Medications: I have reviewed the patient's current medications.  Results for orders placed or performed during the hospital encounter of 04/13/18 (from the past 48 hour(s))  Glucose, capillary     Status: Abnormal   Collection Time: 04/13/18  6:35 AM  Result Value Ref Range   Glucose-Capillary 126 (H) 65 - 99 mg/dL    No results found.  Review of Systems  Constitutional: Negative.   HENT: Negative.   Eyes: Negative.   Respiratory: Negative.   Cardiovascular: Negative.   Gastrointestinal: Negative.   Genitourinary: Negative.   Musculoskeletal: Positive for back pain.  Skin: Negative.   Neurological: Positive for  focal weakness.  Endo/Heme/Allergies: Negative.    Blood pressure (!) 147/108, pulse 72, temperature 98 F (36.7 C), temperature source Oral, resp. rate 18, height 6\' 1"  (1.854 m), weight 88 kg (194 lb), SpO2 98 %. Physical Exam  Constitutional: He appears well-developed.  HENT:  Head: Normocephalic.  Eyes: Pupils are equal, round, and reactive to light.  Neck: Normal range of motion.  Cardiovascular: Normal rate.  Respiratory: Effort normal.  GI: Soft.  Musculoskeletal: He exhibits tenderness.  Neurological:  Slight weakness in right foot dorsiflexors.    Assessment/Plan: Decompressive lumbar Laminectomy and Microdiscectomy at L-5-S-1 on the right  Brandon Knox 04/13/2018, 8:18 AM

## 2018-04-13 NOTE — Anesthesia Procedure Notes (Signed)
Procedure Name: Intubation Date/Time: 04/13/2018 8:42 AM Performed by: Deliah Boston, CRNA Pre-anesthesia Checklist: Patient identified, Emergency Drugs available, Suction available and Patient being monitored Patient Re-evaluated:Patient Re-evaluated prior to induction Oxygen Delivery Method: Circle system utilized Preoxygenation: Pre-oxygenation with 100% oxygen Induction Type: IV induction Ventilation: Mask ventilation without difficulty Laryngoscope Size: Mac and 4 Grade View: Grade II Tube type: Oral Tube size: 7.0 mm Number of attempts: 1 Airway Equipment and Method: Stylet and Oral airway Placement Confirmation: ETT inserted through vocal cords under direct vision,  positive ETCO2 and breath sounds checked- equal and bilateral Secured at: 23 cm Tube secured with: Tape Dental Injury: Teeth and Oropharynx as per pre-operative assessment

## 2018-04-13 NOTE — Consult Note (Signed)
Reason for Consult:Back pain and Right Leg pain Referring Physician: Dr.Amalee Olsen  Brandon Knox is an 46 y.o. male.  HPI: Patient developed right lower leg pain with numbness.and now some left leg pain.  Past Medical History:  Diagnosis Date  . Ankle fracture 2011   RIGHT  . Arthritis    ANKLES  . Chronic sinusitis   . Complication of anesthesia 2001   TOLD BY ANESTHESIA AFTER 2001 SURGERY HAS SLEEP APNEA  . Diabetes mellitus without complication (HCC)    type 2 dm   . Hypertension    STOPPED HTN MEDS 2011 DUE TO COST  . Leg fracture, left 2011  . 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 Sleep apnea    TOLD AFTER 2001 SURGERY HAD SLEEP APNEA 45 LBS LIGHT NOW    Past Surgical History:  Procedure Laterality Date  . CYST REMOVED   2001   GANGLION CYST FROM RIGHT GREAT TOE  . FOREGIN BODY REMOVAL  22 YRS AGO   RIGHT FORE ARM  . SURGERY LEFT LEG AND RIGHT ANKLE  2011   PLATES IN BOTH    History reviewed. No pertinent family history.  Social History:  reports that he has quit smoking. His smoking use included cigarettes. He has a 17.00 pack-year smoking history. He has quit using smokeless tobacco. His smokeless tobacco use included chew. He reports that he drank alcohol. He reports that he does not use drugs.  Allergies: No Known Allergies  Medications: I have reviewed the patient's current medications.  Results for orders placed or performed during the hospital encounter of 04/13/18 (from the past 48 hour(s))  Glucose, capillary     Status: Abnormal   Collection Time: 04/13/18  6:35 AM  Result Value Ref Range   Glucose-Capillary 126 (H) 65 - 99 mg/dL    No results found.  Review of Systems  Constitutional: Negative.   HENT: Negative.   Eyes: Negative.   Respiratory: Negative.   Cardiovascular: Negative.   Gastrointestinal: Negative.   Genitourinary: Negative.   Musculoskeletal: Positive for back pain.  Skin: Negative.   Neurological: Positive for  focal weakness.  Endo/Heme/Allergies: Negative.    Blood pressure (!) 147/108, pulse 72, temperature 98 F (36.7 C), temperature source Oral, resp. rate 18, height 6' 1" (1.854 m), weight 88 kg (194 lb), SpO2 98 %. Physical Exam  Constitutional: He appears well-developed.  HENT:  Head: Normocephalic.  Eyes: Pupils are equal, round, and reactive to light.  Neck: Normal range of motion.  Cardiovascular: Normal rate.  Respiratory: Effort normal.  GI: Soft.  Musculoskeletal: He exhibits tenderness.  Neurological:  Slight weakness in right foot dorsiflexors.    Assessment/Plan: Decompressive lumbar Laminectomy and Microdiscectomy at L-5-S-1 on the right  Brandon Knox 04/13/2018, 8:18 AM     

## 2018-04-13 NOTE — Anesthesia Postprocedure Evaluation (Signed)
Anesthesia Post Note  Patient: Candie MileKeith A Hogle  Procedure(s) Performed: Decompression lumbar laminectomy L5-S1 right (Right Back)     Patient location during evaluation: PACU Anesthesia Type: General Level of consciousness: awake and alert Pain management: pain level controlled Vital Signs Assessment: post-procedure vital signs reviewed and stable Respiratory status: spontaneous breathing, nonlabored ventilation, respiratory function stable and patient connected to nasal cannula oxygen Cardiovascular status: blood pressure returned to baseline and stable Postop Assessment: no apparent nausea or vomiting Anesthetic complications: no    Last Vitals:  Vitals:   04/13/18 1402 04/13/18 1447  BP: 112/74 113/72  Pulse: 81 78  Resp: 16 16  Temp: 36.8 C 36.8 C  SpO2: 98% 98%    Last Pain:  Vitals:   04/13/18 1510  TempSrc:   PainSc: 0-No pain                 Trevor IhaStephen A Raeden Schippers

## 2018-04-13 NOTE — Interval H&P Note (Signed)
History and Physical Interval Note:  04/13/2018 8:28 AM  Brandon Knox  has presented today for surgery, with the diagnosis of Hernitated nucleus pulposus  The various methods of treatment have been discussed with the patient and family. After consideration of risks, benefits and other options for treatment, the patient has consented to  Procedure(s): Decompression lumbar laminectomy L5-S1 right (N/A) as a surgical intervention .  The patient's history has been reviewed, patient examined, no change in status, stable for surgery.  I have reviewed the patient's chart and labs.  Questions were answered to the patient's satisfaction.     Ranee Gosselinonald Solomia Harrell

## 2018-04-13 NOTE — Transfer of Care (Signed)
Immediate Anesthesia Transfer of Care Note  Patient: Brandon Knox  Procedure(s) Performed: Procedure(s): Decompression lumbar laminectomy L5-S1 right (Right)  Patient Location: PACU  Anesthesia Type:General  Level of Consciousness: Patient easily awoken, sedated, comfortable, cooperative, following commands, responds to stimulation.   Airway & Oxygen Therapy: Patient spontaneously breathing, ventilating well, oxygen via simple oxygen mask.  Post-op Assessment: Report given to PACU RN, vital signs reviewed and stable, moving all extremities.   Post vital signs: Reviewed and stable.  Complications: No apparent anesthesia complications  Last Vitals:  Vitals Value Taken Time  BP 153/107 04/13/2018 10:35 AM  Temp    Pulse 82 04/13/2018 10:39 AM  Resp 11 04/13/2018 10:39 AM  SpO2 99 % 04/13/2018 10:39 AM  Vitals shown include unvalidated device data.  Last Pain:  Vitals:   04/13/18 0651  TempSrc:   PainSc: 0-No pain      Patients Stated Pain Goal: 3 (04/13/18 [id number])  Complications: No apparent anesthesia complications

## 2018-04-14 DIAGNOSIS — M5127 Other intervertebral disc displacement, lumbosacral region: Secondary | ICD-10-CM | POA: Diagnosis not present

## 2018-04-14 DIAGNOSIS — Z7982 Long term (current) use of aspirin: Secondary | ICD-10-CM | POA: Diagnosis not present

## 2018-04-14 DIAGNOSIS — Z91120 Patient's intentional underdosing of medication regimen due to financial hardship: Secondary | ICD-10-CM | POA: Diagnosis not present

## 2018-04-14 DIAGNOSIS — M19072 Primary osteoarthritis, left ankle and foot: Secondary | ICD-10-CM | POA: Diagnosis not present

## 2018-04-14 DIAGNOSIS — E119 Type 2 diabetes mellitus without complications: Secondary | ICD-10-CM | POA: Diagnosis not present

## 2018-04-14 DIAGNOSIS — M4807 Spinal stenosis, lumbosacral region: Secondary | ICD-10-CM | POA: Diagnosis not present

## 2018-04-14 DIAGNOSIS — Z79899 Other long term (current) drug therapy: Secondary | ICD-10-CM | POA: Diagnosis not present

## 2018-04-14 DIAGNOSIS — I10 Essential (primary) hypertension: Secondary | ICD-10-CM | POA: Diagnosis not present

## 2018-04-14 DIAGNOSIS — M19071 Primary osteoarthritis, right ankle and foot: Secondary | ICD-10-CM | POA: Diagnosis not present

## 2018-04-14 DIAGNOSIS — Z7984 Long term (current) use of oral hypoglycemic drugs: Secondary | ICD-10-CM | POA: Diagnosis not present

## 2018-04-14 DIAGNOSIS — Z87891 Personal history of nicotine dependence: Secondary | ICD-10-CM | POA: Diagnosis not present

## 2018-04-14 LAB — GLUCOSE, CAPILLARY: Glucose-Capillary: 137 mg/dL — ABNORMAL HIGH (ref 65–99)

## 2018-04-14 MED ORDER — ASPIRIN EC 325 MG PO TBEC: 325.0000 mg | DELAYED_RELEASE_TABLET | Freq: Every day | ORAL | 0 refills | Status: DC

## 2018-04-14 MED ORDER — HYDROCODONE-ACETAMINOPHEN 5-325 MG PO TABS: 1.0000 | ORAL_TABLET | ORAL | 0 refills | Status: DC | PRN

## 2018-04-14 NOTE — Evaluation (Signed)
Physical Therapy Evaluation Patient Details Name: Brandon Knox MRN: [id number] DOB: Jul 27, 1971 Today's Date: 04/14/2018   History of Present Illness  Decompression lumbar laminectomy L5-S1 right (Right, history of bilateral LE fractures  Clinical Impression  The patient is mobilizing well. All education completed by PT.  No further needs.    Follow Up Recommendations No PT follow up    Equipment Recommendations  None recommended by PT    Recommendations for Other Services       Precautions / Restrictions Precautions Precautions: Back      Mobility  Bed Mobility Overal bed mobility: Independent             General bed mobility comments: cues for log roll  Transfers Overall transfer level: Needs assistance Equipment used: Rolling walker (2 wheeled) Transfers: Sit to/from Stand Sit to Stand: Supervision            Ambulation/Gait Ambulation/Gait assistance: Supervision Ambulation Distance (Feet): 400 Feet Assistive device: Rolling walker (2 wheeled);None Gait Pattern/deviations: Step-through pattern     General Gait Details: ambulated x 100 with RW, then 300 without  Stairs            Wheelchair Mobility    Modified Rankin (Stroke Patients Only)       Balance Overall balance assessment: Independent                                           Pertinent Vitals/Pain Pain Assessment: No/denies pain(feels pinch in right thigh)    Home Living Family/patient expects to be discharged to:: Private residence Living Arrangements: Spouse/significant other Available Help at Discharge: Family Type of Home: House Home Access: Stairs to enter   Secretary/administrator of Steps: 1   Home Equipment: Environmental consultant - 2 wheels      Prior Function Level of Independence: Independent               Hand Dominance        Extremity/Trunk Assessment        Lower Extremity Assessment Lower Extremity Assessment: Overall WFL for  tasks assessed    Cervical / Trunk Assessment Cervical / Trunk Assessment: Normal  Communication   Communication: No difficulties  Cognition Arousal/Alertness: Awake/alert Behavior During Therapy: WFL for tasks assessed/performed Overall Cognitive Status: Within Functional Limits for tasks assessed                                        General Comments      Exercises     Assessment/Plan    PT Assessment Patent does not need any further PT services  PT Problem List         PT Treatment Interventions      PT Goals (Current goals can be found in the Care Plan section)  Acute Rehab PT Goals Patient Stated Goal: go home PT Goal Formulation: All assessment and education complete, DC therapy    Frequency     Barriers to discharge        Co-evaluation               AM-PAC PT "6 Clicks" Daily Activity  Outcome Measure Difficulty turning over in bed (including adjusting bedclothes, sheets and blankets)?: None Difficulty moving from lying on back to sitting on the side of the  bed? : None Difficulty sitting down on and standing up from a chair with arms (e.g., wheelchair, bedside commode, etc,.)?: None Help needed moving to and from a bed to chair (including a wheelchair)?: None Help needed walking in hospital room?: None Help needed climbing 3-5 steps with a railing? : A Little 6 Click Score: 23    End of Session   Activity Tolerance: Patient tolerated treatment well Patient left: in chair;with call bell/phone within reach;with family/visitor present Nurse Communication: Mobility status      Time: [id number] PT Time Calculation (min) (ACUTE ONLY): 20 min   Charges:   PT Evaluation $PT Eval Low Complexity: 1 Low     PT G CodesBlanchard Kelch:        Obrian Bulson PT [phone]  Rada HayHill, Jamilett Ferrante Elizabeth 04/14/2018, 9:09 AM

## 2018-04-14 NOTE — Evaluation (Signed)
Occupational Therapy Evaluation Patient Details Name: Brandon Knox MRN: [id number] DOB: Sep 15, 1971 Today's Date: 04/14/2018    History of Present Illness Decompression lumbar laminectomy L5-S1 right (Right, history of bilateral LE fractures   Clinical Impression   OT education complete regarding back precautions and ADL activity     Follow Up Recommendations  No OT follow up    Equipment Recommendations  None recommended by OT    Recommendations for Other Services       Precautions / Restrictions Precautions Precautions: Back Restrictions Weight Bearing Restrictions: No      Mobility Bed Mobility Overal bed mobility: Independent             General bed mobility comments: cues for log roll  Transfers Overall transfer level: Needs assistance Equipment used: Rolling walker (2 wheeled) Transfers: Sit to/from Stand Sit to Stand: Supervision              Balance Overall balance assessment: Independent                                         ADL either performed or assessed with clinical judgement   ADL Overall ADL's : At baseline                                       General ADL Comments: Pt able to perform all ADL activity.  Educated in back precautions. Pt able to verbalize and return demonstrate     Vision Patient Visual Report: No change from baseline              Pertinent Vitals/Pain Pain Assessment: No/denies pain     Hand Dominance     Extremity/Trunk Assessment Upper Extremity Assessment Upper Extremity Assessment: Overall WFL for tasks assessed   Lower Extremity Assessment Lower Extremity Assessment: Overall WFL for tasks assessed   Cervical / Trunk Assessment Cervical / Trunk Assessment: Normal   Communication Communication Communication: No difficulties   Cognition Arousal/Alertness: Awake/alert Behavior During Therapy: WFL for tasks assessed/performed Overall Cognitive Status:  Within Functional Limits for tasks assessed                                                Home Living Family/patient expects to be discharged to:: Private residence Living Arrangements: Spouse/significant other Available Help at Discharge: Family Type of Home: House Home Access: Stairs to enter Secretary/administrator of Steps: 1   Home Layout: One level     Bathroom Shower/Tub: Tub/shower unit         Home Equipment: Environmental consultant - 2 wheels          Prior Functioning/Environment Level of Independence: Independent                          OT Goals(Current goals can be found in the care plan section) Acute Rehab OT Goals Patient Stated Goal: go home  OT Frequency:      AM-PAC PT "6 Clicks" Daily Activity     Outcome Measure Help from another person eating meals?: None Help from another person taking care of personal grooming?: None Help from another person toileting, which  includes using toliet, bedpan, or urinal?: None Help from another person bathing (including washing, rinsing, drying)?: None Help from another person to put on and taking off regular upper body clothing?: None Help from another person to put on and taking off regular lower body clothing?: None 6 Click Score: 24   End of Session Nurse Communication: Mobility status  Activity Tolerance: Patient tolerated treatment well Patient left: in chair                   Time: 1008-1035 OT Time Calculation (min): 27 min Charges:  OT General Charges $OT Visit: 1 Visit OT Evaluation $OT Eval Moderate Complexity: 1 Mod OT Treatments $Self Care/Home Management : 8-22 mins G-Codes:     Lise AuerLori Rico Massar, OT [phone]  Einar CrowEDDING, [name] 04/14/2018, 10:47 AM

## 2018-04-14 NOTE — Progress Notes (Signed)
Subjective: 1 Day Post-Op Procedure(s) (LRB): Decompression lumbar laminectomy L5-S1 right (Right) Patient reports pain as 1 on 0-10 scale. Doing well today.No further leg pain.   Objective: Vital signs in last 24 hours: Temp:  [97.8 F (36.6 C)-98.7 F (37.1 C)] 98.4 F (36.9 C) (06/13 0539) Pulse Rate:  [72-87] 78 (06/13 0539) Resp:  [10-17] 17 (06/13 0159) BP: (112-155)/(72-107) 130/84 (06/13 0539) SpO2:  [95 %-99 %] 96 % (06/13 0539)  Intake/Output from previous day: 06/12 0701 - 06/13 0700 In: 3086.7 [P.O.:1400; I.V.:1586.7; IV Piggyback:100] Out: 3850 [Urine:3825; Blood:25] Intake/Output this shift: No intake/output data recorded.  No results for input(s): HGB in the last 72 hours. No results for input(s): WBC, RBC, HCT, PLT in the last 72 hours. No results for input(s): NA, K, CL, CO2, BUN, CREATININE, GLUCOSE, CALCIUM in the last 72 hours. No results for input(s): LABPT, INR in the last 72 hours.  Neurologically intact Dorsiflexion/Plantar flexion intact  Doing well.  Assessment/Plan: 1 Day Post-Op Procedure(s) (LRB): Decompression lumbar laminectomy L5-S1 right (Right) Discharge today.after therapy.    Ranee Gosselinonald Letishia Elliott 04/14/2018, 7:08 AM

## 2018-04-14 NOTE — Progress Notes (Signed)
Patient discharged to home with wife. Given all belongings, instructions, prescriptions. Wife present during dressing change. All questions answered. Escorted to pov via w/c.

## 2018-04-14 NOTE — Discharge Instructions (Signed)
For the first few days, remove your dressing, and tape a piece of saran wrap over your incision. Take your shower, then remove the saran wrap and put a clean dressing on. After three days you can shower without the saran wrap.  Call Dr. Darrelyn HillockGioffre if any wound complications or temperature of 101 degrees F or over.  Call the office for an appointment to see Dr. Darrelyn HillockGioffre in two weeks: [phone] and ask for Dr. Jeannetta EllisGioffre's nurse, Mackey Birchwoodammy Johnson.  No lifting or excessive bending. No driving while taking pain medications.

## 2018-04-14 NOTE — Discharge Summary (Signed)
Physician Discharge Summary   Patient ID: ABDULHAMID OLGIN MRN: [id number] DOB/AGE: [birth year] 47 y.o.  Admit date: 04/13/2018 Discharge date: 04/14/2018  Primary Diagnosis:   Hernitated nucleus pulposus  Admission Diagnoses:  Past Medical History:  Diagnosis Date  . Ankle fracture 2011   RIGHT  . Arthritis    ANKLES  . Chronic sinusitis   . Complication of anesthesia 2001   TOLD BY ANESTHESIA AFTER 2001 SURGERY HAS SLEEP APNEA  . Diabetes mellitus without complication (HCC)    type 2 dm   . Hypertension    STOPPED HTN MEDS 2011 DUE TO COST  . Leg fracture, left 2011  . 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 Sleep apnea    TOLD AFTER 2001 SURGERY HAD SLEEP APNEA 46 LBS LIGHT NOW   Discharge Diagnoses:   Active Problems:   Spinal stenosis, lumbar region with neurogenic claudication  Procedure:  Procedure(s) (LRB): Decompression lumbar laminectomy L5-S1 right (Right)   Consults: None  HPI: The patient is a 47 year old male who presented to the office with the chief complaint of low back pain and right leg pain. He did not improve with conservative treatments. MRI showed a large disc herniation at L5-S1 on the right.     Laboratory Data: Hospital Outpatient Visit on 04/08/2018  Component Date Value Ref Range Status  . Glucose-Capillary 04/08/2018 110* 65 - 99 mg/dL Final  . aPTT 04/08/2018 33  24 - 36 seconds Final   Performed at [facility], [place] [address] [place], [place] [address]  . WBC 04/08/2018 7.0  4.0 - 10.5 K/uL Final  . RBC 04/08/2018 5.34  4.22 - 5.81 MIL/uL Final  . Hemoglobin 04/08/2018 16.3  13.0 - 17.0 g/dL Final  . HCT 04/08/2018 47.6  39.0 - 52.0 % Final  . MCV 04/08/2018 89.1  78.0 - 100.0 fL Final  . MCH 04/08/2018 30.5  26.0 - 34.0 pg Final  . MCHC 04/08/2018 34.2  30.0 - 36.0 g/dL Final  . RDW 04/08/2018 12.3  11.5 - 15.5 % Final  . Platelets 04/08/2018 198  150 - 400 K/uL Final  . Neutrophils Relative %  04/08/2018 49  % Final  . Neutro Abs 04/08/2018 3.5  1.7 - 7.7 K/uL Final  . Lymphocytes Relative 04/08/2018 41  % Final  . Lymphs Abs 04/08/2018 2.9  0.7 - 4.0 K/uL Final  . Monocytes Relative 04/08/2018 7  % Final  . Monocytes Absolute 04/08/2018 0.5  0.1 - 1.0 K/uL Final  . Eosinophils Relative 04/08/2018 3  % Final  . Eosinophils Absolute 04/08/2018 0.2  0.0 - 0.7 K/uL Final  . Basophils Relative 04/08/2018 0  % Final  . Basophils Absolute 04/08/2018 0.0  0.0 - 0.1 K/uL Final   Performed at [facility], [place] [address], [place], [place] [address]  . Sodium 04/08/2018 139  135 - 145 mmol/L Final  . Potassium 04/08/2018 3.8  3.5 - 5.1 mmol/L Final  . Chloride 04/08/2018 103  101 - 111 mmol/L Final  . CO2 04/08/2018 27  22 - 32 mmol/L Final  . Glucose, Bld 04/08/2018 116* 65 - 99 mg/dL Final  . BUN 04/08/2018 19  6 - 20 mg/dL Final  . Creatinine, Ser 04/08/2018 0.80  0.61 - 1.24 mg/dL Final  . Calcium 04/08/2018 9.3  8.9 - 10.3 mg/dL Final  . Total Protein 04/08/2018 7.3  6.5 - 8.1 g/dL Final  . Albumin 04/08/2018 4.6  3.5 - 5.0 g/dL Final  . AST 04/08/2018 17  15 - 41 U/L Final  . ALT 04/08/2018 19  17 - 63 U/L Final  . Alkaline Phosphatase 04/08/2018 56  38 - 126 U/L Final  . Total Bilirubin 04/08/2018 1.3* 0.3 - 1.2 mg/dL Final  . GFR calc non Af Amer 04/08/2018 >60  >60 mL/min Final  . GFR calc Af Amer 04/08/2018 >60  >60 mL/min Final   Comment: (NOTE) The eGFR has been calculated using the CKD EPI equation. This calculation has not been validated in all clinical situations. eGFR's persistently <60 mL/min signify possible Chronic Kidney Disease.   Georgiann Hahn gap 04/08/2018 9  5 - 15 Final   Performed at [facility], [place] [address] [place], [place] [address]  . Prothrombin Time 04/08/2018 13.3  11.4 - 15.2 seconds Final  . INR 04/08/2018 1.02   Final   Performed at [facility], [place] [address], [place], [place] [address]   . ABO/RH(D) 04/08/2018 A NEG   Final  . Antibody Screen 04/08/2018 NEG   Final  . Sample Expiration 04/08/2018 04/16/2018   Final  . Extend sample reason 04/08/2018    Final                   Value:NO TRANSFUSIONS OR PREGNANCY IN THE PAST 3 MONTHS Performed at [facility], [place] [address] [place], [place] [address]   . Color, Urine 04/08/2018 STRAW* YELLOW Final  . APPearance 04/08/2018 CLEAR  CLEAR Final  . Specific Gravity, Urine 04/08/2018 1.007  1.005 - 1.030 Final  . pH 04/08/2018 5.0  5.0 - 8.0 Final  . Glucose, UA 04/08/2018 >=500* NEGATIVE mg/dL Final  . Hgb urine dipstick 04/08/2018 NEGATIVE  NEGATIVE Final  . Bilirubin Urine 04/08/2018 NEGATIVE  NEGATIVE Final  . Ketones, ur 04/08/2018 NEGATIVE  NEGATIVE mg/dL Final  . Protein, ur 04/08/2018 NEGATIVE  NEGATIVE mg/dL Final  . Nitrite 04/08/2018 NEGATIVE  NEGATIVE Final  . Leukocytes, UA 04/08/2018 NEGATIVE  NEGATIVE Final  . RBC / HPF 04/08/2018 0-5  0 - 5 RBC/hpf Final  . WBC, UA 04/08/2018 0-5  0 - 5 WBC/hpf Final  . Bacteria, UA 04/08/2018 RARE* NONE SEEN Final   Performed at [facility], [place] [address] [place], [place] [address]  . MRSA, PCR 04/08/2018 NEGATIVE  NEGATIVE Final  . Staphylococcus aureus 04/08/2018 NEGATIVE  NEGATIVE Final   Comment: (NOTE) The Xpert SA Assay (FDA approved for NASAL specimens in patients 71 years of age and older), is one component of a comprehensive surveillance program. It is not intended to diagnose infection nor to guide or monitor treatment. Performed at [facility], [place] [address], [place],  [address]     X-Rays:Dg Lumbar Spine 2-3 Views  Result Date: 04/08/2018 CLINICAL DATA:  Preop EXAM: LUMBAR SPINE - 2-3 VIEW COMPARISON:  Plain film of the lumbar spine dated 12/23/2017. FINDINGS: Five lumbar type vertebral bodies. Stable mild scoliosis. No acute findings. Mild disc space narrowing again noted at L5-S1, stable.  Remainder of the disc spaces are well maintained. Degenerative facet arthropathy again noted at the L4-5 and L5-S1 levels. Again noted is aortic atherosclerosis. Visualized paravertebral soft tissues are otherwise unremarkable. IMPRESSION: 1. No acute findings. 2. Degenerative changes within the lower lumbar spine, as detailed above, stable compared to the previous study of 12/23/2017. 3. Aortic atherosclerosis. Electronically Signed   By: Franki Cabot M.D.   On: 04/08/2018  11:59   Dg Spine Portable 1 View  Result Date: 04/13/2018 CLINICAL DATA:  Lumbar localization EXAM: PORTABLE SPINE - 1 VIEW COMPARISON:  Film from earlier in the same day FINDINGS: Surgical retractors are noted as well as a metallic instrument at the level of L5-S1. the numbering nomenclature is similar to that utilized on the initial image. IMPRESSION: Intraoperative localization at L5-S1 Electronically Signed   By: Inez Catalina M.D.   On: 04/13/2018 10:04   Dg Spine Portable 1 View  Result Date: 04/13/2018 CLINICAL DATA:  L5-S1 lumbar decompression on RIGHT EXAM: PORTABLE SPINE - 1 VIEW COMPARISON:  Portable exam 0853 hours compared to 0839 hours FINDINGS: 5 lumbar vertebra labeled on prior exam, current exam labeled similarly. Metallic probe via dorsal approach projects dorsal to the L5-S1 disc space level. IMPRESSION: Dorsal localization of the L5-S1 disc space level. Electronically Signed   By: Lavonia Dana M.D.   On: 04/13/2018 09:26   Dg Spine Portable 1 View  Result Date: 04/13/2018 CLINICAL DATA:  L5-S1 decompression EXAM: PORTABLE SPINE - 1 VIEW COMPARISON:  April 08, 2018 FINDINGS: Cross-table lateral lumbar image labeled #1 submitted. Metallic probe tips are posterior to the S1 and S2 vertebral bodies. No fracture or spondylolisthesis. There is aortic atherosclerosis. IMPRESSION: Metallic probe tips are posterior to the S1 and S2 vertebral body levels. No fracture or spondylolisthesis. There is aortic atherosclerosis. Aortic  Atherosclerosis (ICD10-I70.0). Electronically Signed   By: Lowella Grip III M.D.   On: 04/13/2018 09:11    EKG: Orders placed or performed during the hospital encounter of 12/23/17  . EKG  . EKG     Hospital Course: Patient was admitted to [facility] and taken to the OR and underwent the above state procedure without complications.  Patient tolerated the procedure well and was later transferred to the recovery room and then to the orthopaedic floor for postoperative care.  They were given PO and IV analgesics for pain control following their surgery.  They were given 24 hours of postoperative antibiotics.   PT was consulted postop to assist with mobility and transfers.  The patient was allowed to be WBAT with therapy and was taught back precautions. Discharge planning was consulted to help with postop disposition and equipment needs.  Patient had a good night on the evening of surgery and started to get up OOB with therapy on day one. Patient was seen in rounds and was ready to go home on day one.  They were given discharge instructions and dressing directions.  They were instructed on when to follow up in the office with Dr. Gladstone Lighter.   Diet: Cardiac diet and Diabetic diet Activity:WBAT Follow-up:in 2 weeks Disposition - Home Discharged Condition: stable   Discharge Instructions    Call MD / Call 911   Complete by:  As directed    If you experience chest pain or shortness of breath, CALL 911 and be transported to the hospital emergency room.  If you develope a fever above 101 F, pus (white drainage) or increased drainage or redness at the wound, or calf pain, call your surgeon's office.   Constipation Prevention   Complete by:  As directed    Drink plenty of fluids.  Prune juice may be helpful.  You may use a stool softener, such as Colace (over the counter) 100 mg twice a day.  Use MiraLax (over the counter) for constipation as needed.   Diet - low sodium heart healthy    Complete by:  As directed    Diet Carb Modified   Complete by:  As directed    Discharge instructions   Complete by:  As directed    For the first few days, remove your dressing, and tape a piece of saran wrap over your incision. Take your shower, then remove the saran wrap and put a clean dressing on. After three days you can shower without the saran wrap.  Call Dr. Gladstone Lighter if any wound complications or temperature of 101 degrees F or over.  Call the office for an appointment to see Dr. Gladstone Lighter in two weeks: [phone] and ask for Dr. Charlestine Night nurse, Brunilda Payor.  No lifting or excessive bending. No driving while taking pain medications.   Increase activity slowly as tolerated   Complete by:  As directed      Allergies as of 04/14/2018   No Known Allergies     Medication List    TAKE these medications   aspirin EC 325 MG tablet Take 1 tablet (325 mg total) by mouth daily.   glipiZIDE 5 MG tablet Commonly known as:  GLUCOTROL Take 5 mg by mouth 2 (two) times daily.   HYDROcodone-acetaminophen 5-325 MG tablet Commonly known as:  NORCO/VICODIN Take 1 tablet by mouth every 4 (four) hours as needed for moderate pain ((score 4 to 6)).   SYNJARDY 12.5-500 MG Tabs Generic drug:  Empagliflozin-metFORMIN HCl Take 1 tablet by mouth 2 (two) times daily with a meal.      Follow-up Information    Latanya Maudlin, MD. Schedule an appointment as soon as possible for a visit in 2 week(s).   Specialty:  Orthopedic Surgery Contact information: [address] [place] [address] [phone]           Signed: Ardeen Jourdain, PA-C Orthopaedic Surgery 04/14/2018, 11:14 AM

## 2018-05-10 DIAGNOSIS — M545 Low back pain: Secondary | ICD-10-CM | POA: Diagnosis not present

## 2018-05-19 DIAGNOSIS — M545 Low back pain: Secondary | ICD-10-CM | POA: Diagnosis not present

## 2018-05-27 DIAGNOSIS — M545 Low back pain: Secondary | ICD-10-CM | POA: Diagnosis not present

## 2018-06-03 DIAGNOSIS — M545 Low back pain: Secondary | ICD-10-CM | POA: Diagnosis not present

## 2018-06-07 DIAGNOSIS — I10 Essential (primary) hypertension: Secondary | ICD-10-CM | POA: Diagnosis not present

## 2018-06-07 DIAGNOSIS — E119 Type 2 diabetes mellitus without complications: Secondary | ICD-10-CM | POA: Diagnosis not present

## 2018-06-07 DIAGNOSIS — E663 Overweight: Secondary | ICD-10-CM | POA: Diagnosis not present

## 2018-06-10 DIAGNOSIS — M545 Low back pain: Secondary | ICD-10-CM | POA: Diagnosis not present

## 2018-07-02 IMAGING — DX DG CHEST 2V
2 series · 2 of 2 positions shown · non-contrast
Comparison: Chest x-ray of 06/23/2010

CLINICAL DATA: Preop for lumbar spine surgery, history of right leg
pain

EXAM:
CHEST  2 VIEW

[chest pa]
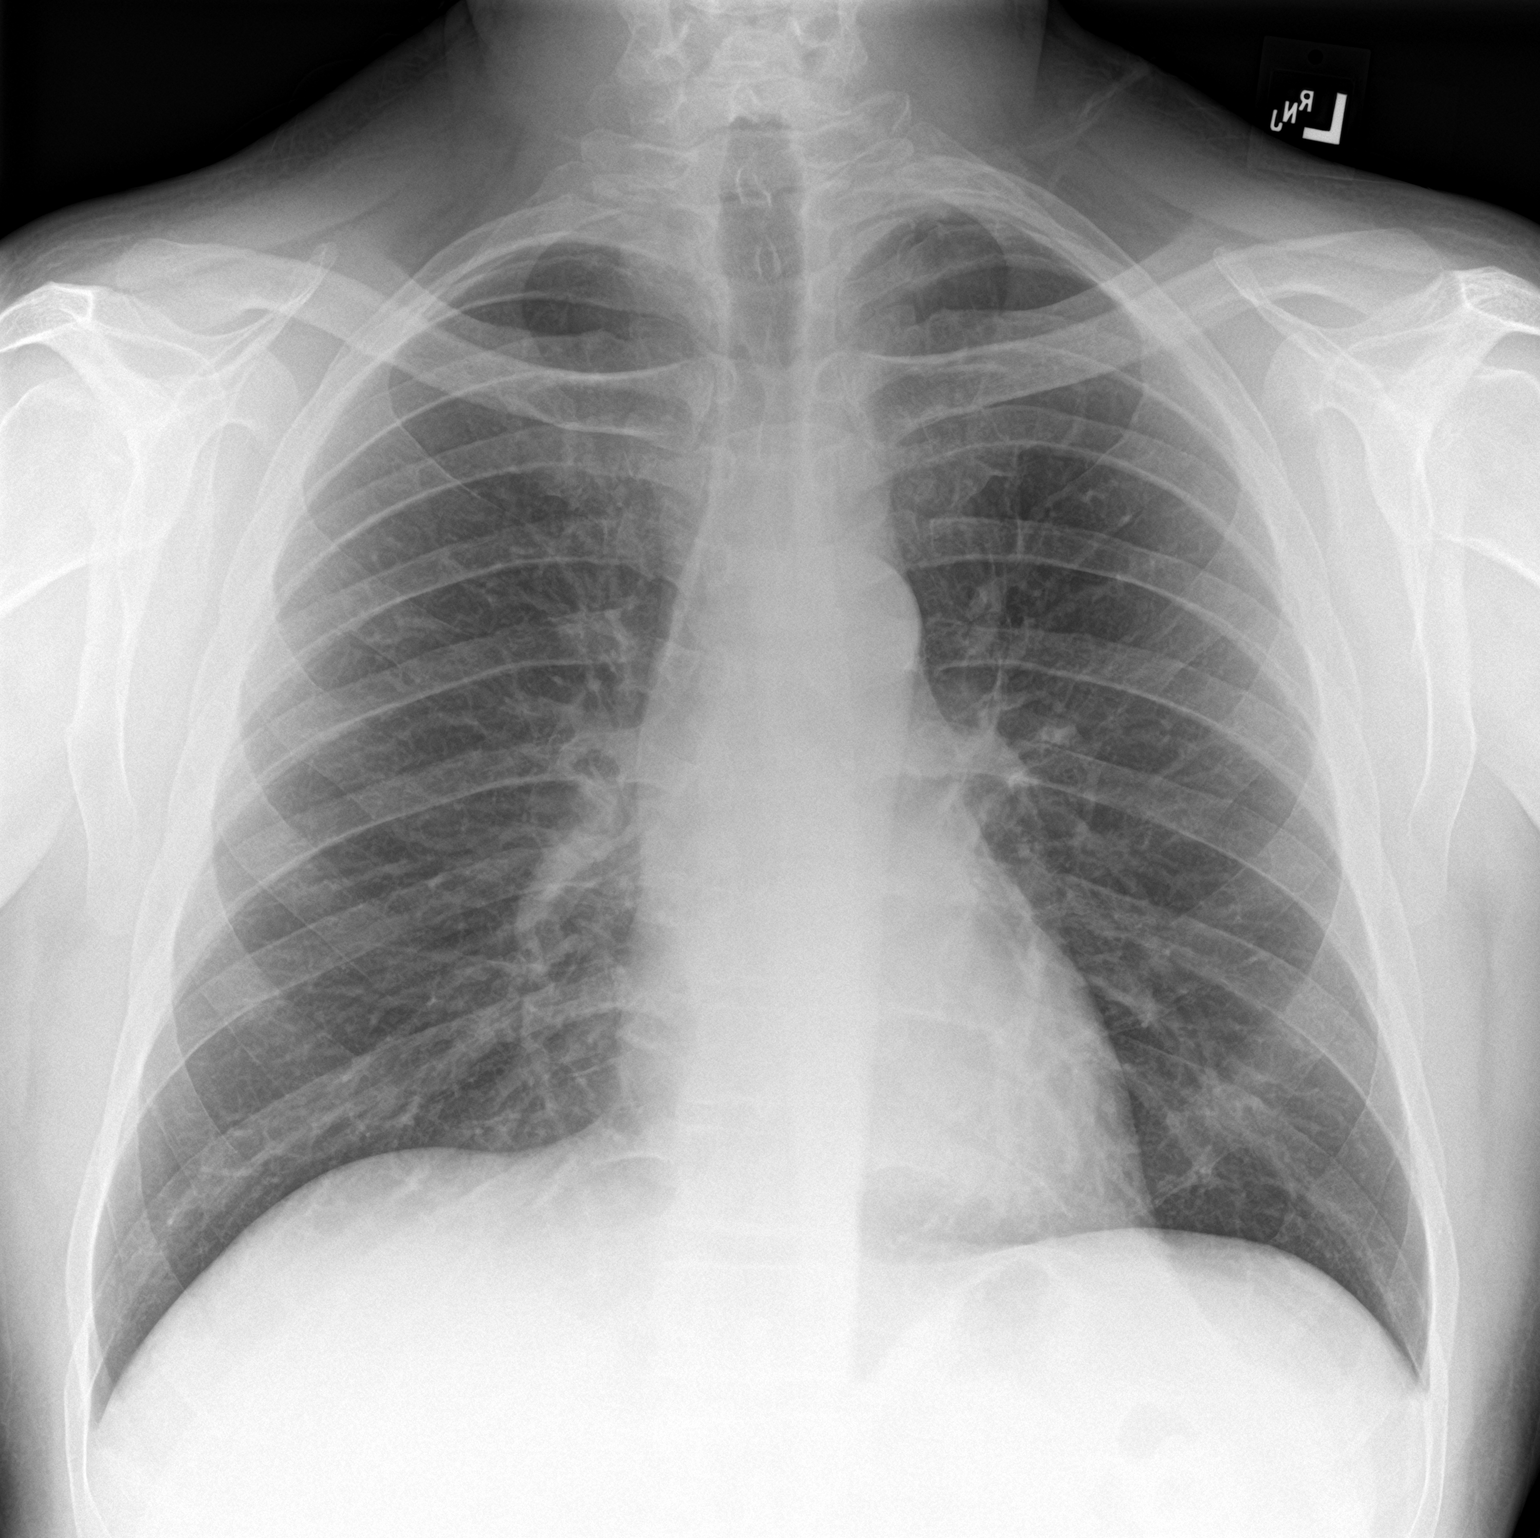

[chest lat]
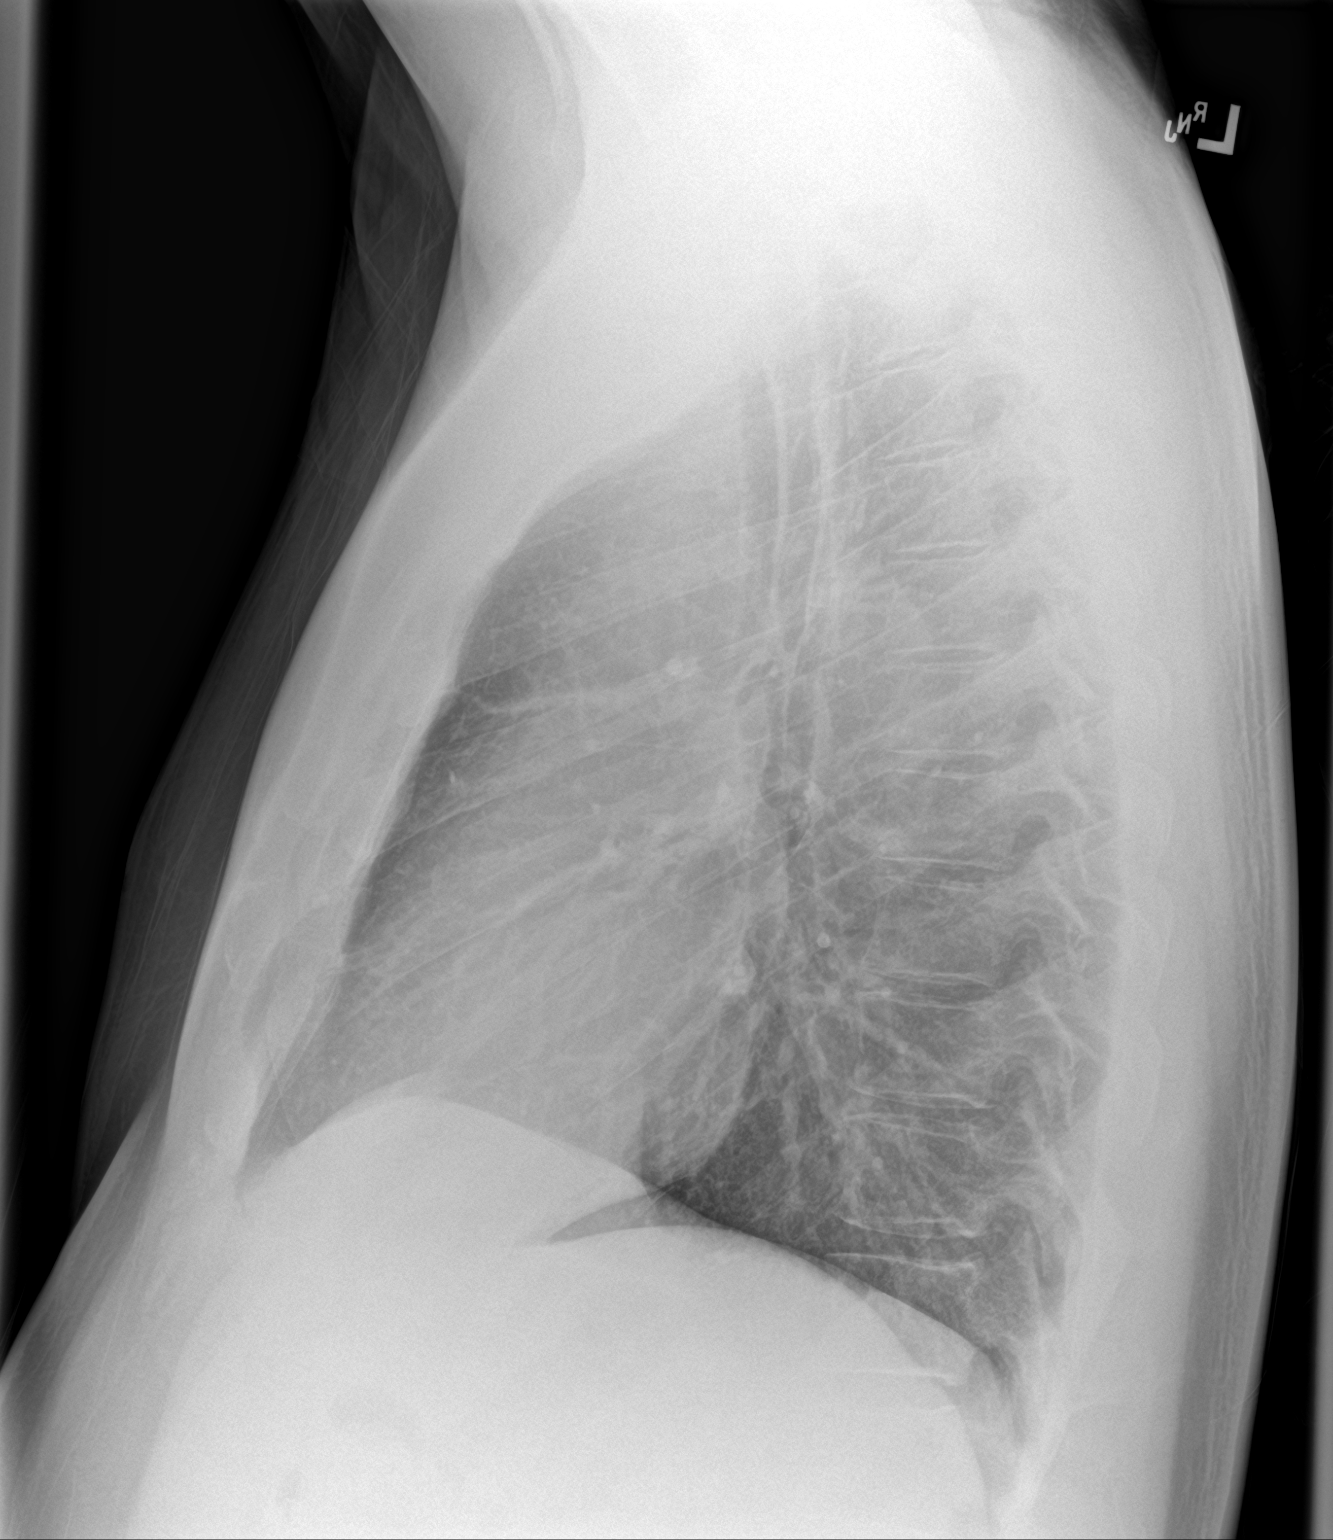

[2 of 2 positions shown; findings below may reference images not displayed]

FINDINGS: No active infiltrate or effusion is seen. Mediastinal and hilar
contours are unremarkable. The heart is within normal limits in
size. No bony abnormality is seen.
IMPRESSION: No active cardiopulmonary disease.

## 2018-07-14 DIAGNOSIS — M48061 Spinal stenosis, lumbar region without neurogenic claudication: Secondary | ICD-10-CM | POA: Diagnosis not present

## 2018-08-22 DIAGNOSIS — E119 Type 2 diabetes mellitus without complications: Secondary | ICD-10-CM | POA: Diagnosis not present

## 2018-09-09 DIAGNOSIS — E119 Type 2 diabetes mellitus without complications: Secondary | ICD-10-CM | POA: Diagnosis not present

## 2018-09-09 DIAGNOSIS — E663 Overweight: Secondary | ICD-10-CM | POA: Diagnosis not present

## 2018-09-09 DIAGNOSIS — I10 Essential (primary) hypertension: Secondary | ICD-10-CM | POA: Diagnosis not present

## 2018-09-09 DIAGNOSIS — N529 Male erectile dysfunction, unspecified: Secondary | ICD-10-CM | POA: Diagnosis not present

## 2019-03-15 DIAGNOSIS — H6506 Acute serous otitis media, recurrent, bilateral: Secondary | ICD-10-CM | POA: Diagnosis not present

## 2019-03-15 DIAGNOSIS — H6980 Other specified disorders of Eustachian tube, unspecified ear: Secondary | ICD-10-CM | POA: Diagnosis not present

## 2019-03-15 DIAGNOSIS — I10 Essential (primary) hypertension: Secondary | ICD-10-CM | POA: Diagnosis not present

## 2023-06-30 DIAGNOSIS — G4730 Sleep apnea, unspecified: Secondary | ICD-10-CM | POA: Diagnosis not present

## 2023-06-30 DIAGNOSIS — R6882 Decreased libido: Secondary | ICD-10-CM | POA: Diagnosis not present

## 2023-06-30 DIAGNOSIS — Z125 Encounter for screening for malignant neoplasm of prostate: Secondary | ICD-10-CM | POA: Diagnosis not present

## 2023-06-30 DIAGNOSIS — E119 Type 2 diabetes mellitus without complications: Secondary | ICD-10-CM | POA: Diagnosis not present

## 2023-06-30 DIAGNOSIS — Z79899 Other long term (current) drug therapy: Secondary | ICD-10-CM | POA: Diagnosis not present

## 2023-06-30 DIAGNOSIS — E785 Hyperlipidemia, unspecified: Secondary | ICD-10-CM | POA: Diagnosis not present

## 2023-07-15 ENCOUNTER — Encounter: Payer: Self-pay | Admitting: Gastroenterology

## 2023-07-21 DIAGNOSIS — G4733 Obstructive sleep apnea (adult) (pediatric): Secondary | ICD-10-CM | POA: Diagnosis not present

## 2023-07-21 DIAGNOSIS — Z713 Dietary counseling and surveillance: Secondary | ICD-10-CM | POA: Diagnosis not present

## 2023-07-21 DIAGNOSIS — R0602 Shortness of breath: Secondary | ICD-10-CM | POA: Diagnosis not present

## 2023-07-21 DIAGNOSIS — E1169 Type 2 diabetes mellitus with other specified complication: Secondary | ICD-10-CM | POA: Diagnosis not present

## 2023-07-22 DIAGNOSIS — R0602 Shortness of breath: Secondary | ICD-10-CM | POA: Diagnosis not present

## 2023-07-22 DIAGNOSIS — G4733 Obstructive sleep apnea (adult) (pediatric): Secondary | ICD-10-CM | POA: Diagnosis not present

## 2023-07-25 DIAGNOSIS — R0602 Shortness of breath: Secondary | ICD-10-CM | POA: Diagnosis not present

## 2023-07-25 DIAGNOSIS — G4733 Obstructive sleep apnea (adult) (pediatric): Secondary | ICD-10-CM | POA: Diagnosis not present

## 2023-08-26 DIAGNOSIS — Z713 Dietary counseling and surveillance: Secondary | ICD-10-CM | POA: Diagnosis not present

## 2023-08-26 DIAGNOSIS — E119 Type 2 diabetes mellitus without complications: Secondary | ICD-10-CM | POA: Diagnosis not present

## 2023-08-30 ENCOUNTER — Other Ambulatory Visit: Payer: Self-pay

## 2023-08-30 DIAGNOSIS — E1165 Type 2 diabetes mellitus with hyperglycemia: Secondary | ICD-10-CM

## 2023-09-02 ENCOUNTER — Other Ambulatory Visit (INDEPENDENT_AMBULATORY_CARE_PROVIDER_SITE_OTHER): Payer: BC Managed Care – PPO

## 2023-09-02 DIAGNOSIS — E1165 Type 2 diabetes mellitus with hyperglycemia: Secondary | ICD-10-CM | POA: Diagnosis not present

## 2023-09-02 DIAGNOSIS — Z794 Long term (current) use of insulin: Secondary | ICD-10-CM | POA: Diagnosis not present

## 2023-09-02 LAB — HEMOGLOBIN A1C: Hgb A1c MFr Bld: 8.8 % — ABNORMAL HIGH (ref 4.6–6.5)

## 2023-09-02 LAB — COMPREHENSIVE METABOLIC PANEL
ALT: 23 U/L (ref 0–53)
AST: 21 U/L (ref 0–37)
Albumin: 4.6 g/dL (ref 3.5–5.2)
Alkaline Phosphatase: 57 U/L (ref 39–117)
BUN: 21 mg/dL (ref 6–23)
CO2: 29 meq/L (ref 19–32)
Calcium: 9.7 mg/dL (ref 8.4–10.5)
Chloride: 101 meq/L (ref 96–112)
Creatinine, Ser: 0.67 mg/dL (ref 0.40–1.50)
GFR: 108.52 mL/min (ref >60.00)
Glucose, Bld: 153 mg/dL — ABNORMAL HIGH (ref 70–99)
Potassium: 4.1 meq/L (ref 3.5–5.1)
Sodium: 139 meq/L (ref 135–145)
Total Bilirubin: 0.5 mg/dL (ref 0.2–1.2)
Total Protein: 7.3 g/dL (ref 6.0–8.3)

## 2023-09-02 LAB — LIPID PANEL
Cholesterol: 144 mg/dL (ref 0–200)
HDL: 49.8 mg/dL (ref >39.00)
LDL Cholesterol: 78 mg/dL (ref 0–99)
NonHDL: 93.89
Total CHOL/HDL Ratio: 3
Triglycerides: 81 mg/dL (ref 0.0–149.0)
VLDL: 16.2 mg/dL (ref 0.0–40.0)

## 2023-09-02 LAB — MICROALBUMIN / CREATININE URINE RATIO
Creatinine,U: 95.4 mg/dL
Microalb Creat Ratio: 0.8 mg/g (ref 0.0–30.0)
Microalb, Ur: 0.7 mg/dL (ref 0.0–1.9)

## 2023-09-07 ENCOUNTER — Encounter: Payer: Self-pay | Admitting: "Endocrinology

## 2023-09-07 ENCOUNTER — Ambulatory Visit (INDEPENDENT_AMBULATORY_CARE_PROVIDER_SITE_OTHER): Payer: BC Managed Care – PPO | Admitting: "Endocrinology

## 2023-09-07 VITALS — BP 115/80 | HR 94 | Ht 73.0 in | Wt 183.0 lb

## 2023-09-07 DIAGNOSIS — Z794 Long term (current) use of insulin: Secondary | ICD-10-CM | POA: Diagnosis not present

## 2023-09-07 DIAGNOSIS — E1165 Type 2 diabetes mellitus with hyperglycemia: Secondary | ICD-10-CM | POA: Diagnosis not present

## 2023-09-07 DIAGNOSIS — E7800 Pure hypercholesterolemia, unspecified: Secondary | ICD-10-CM

## 2023-09-07 DIAGNOSIS — Z7984 Long term (current) use of oral hypoglycemic drugs: Secondary | ICD-10-CM

## 2023-09-07 MED ORDER — EMPAGLIFLOZIN 10 MG PO TABS: 10.0000 mg | ORAL_TABLET | Freq: Every day | ORAL | 4 refills | Status: AC

## 2023-09-07 MED ORDER — INSULIN GLARGINE 100 UNIT/ML ~~LOC~~ SOLN: 28.0000 [IU] | Freq: Every day | SUBCUTANEOUS | 2 refills | Status: DC

## 2023-09-07 NOTE — Patient Instructions (Signed)

## 2023-09-07 NOTE — Progress Notes (Signed)
Outpatient Endocrinology Note [place], MD  09/07/23   KOLTER REAVER 01/30/1971 [id number]  Referring Provider: Jim Like, NP Primary Care Provider: Dulce Sellar, NP Reason for consultation: Subjective   Assessment & Plan  Diagnoses and all orders for this visit:  Uncontrolled type 2 diabetes mellitus with hyperglycemia (HCC)  Long term (current) use of oral hypoglycemic drugs  Long-term insulin use (HCC)  Pure hypercholesterolemia  Other orders -     empagliflozin (JARDIANCE) 10 MG TABS tablet; Take 1 tablet (10 mg total) by mouth daily before breakfast. -     insulin glargine (SEMGLEE) 100 UNIT/ML injection; Inject 0.28 mLs (28 Units total) into the skin daily.    Diabetes Type II complicated by neuropathy Lab Results  Component Value Date   GFR 108.52 09/02/2023   Hba1c goal less than 7, current Hba1c is  Lab Results  Component Value Date   HGBA1C 8.8 (H) 09/02/2023   Will recommend the following: Semglee 28 units every day  Glimepiride 2 mg every day   Patient is happy with the above regimen and would like to stick to it Patient is against taking metformin, doesn's engage in the discussion of immediate release/slow release, however patient tolerated synjardy well for 1 month in past   No known contraindications/side effects to any of above medications  -Last LD and Tg are as follows: Lab Results  Component Value Date   LDLCALC 78 09/02/2023    Lab Results  Component Value Date   TRIG 81.0 09/02/2023   -atorvastatin 10 mg every day  -Follow low fat diet and exercise   -Blood pressure goal <140/90 - Microalbumin/creatinine goal is < 30 -Last MA/Cr is as follows: Lab Results  Component Value Date   MICROALBUR 0.7 09/02/2023   -not on ACE/ARB  -diet changes including salt restriction -limit eating outside -counseled BP targets per standards of diabetes care -uncontrolled blood pressure can lead to retinopathy,  nephropathy and cardiovascular and atherosclerotic heart disease  Reviewed and counseled on: -A1C target -Blood sugar targets -Complications of uncontrolled diabetes  -Checking blood sugar before meals and bedtime and bring log next visit -All medications with mechanism of action and side effects -Hypoglycemia management: rule of 15's, Glucagon Emergency Kit and medical alert ID -low-carb low-fat plate-method diet -At least 20 minutes of physical activity per day -Annual dilated retinal eye exam and foot exam -compliance and follow up needs -follow up as scheduled or earlier if problem gets worse  Call if blood sugar is less than 70 or consistently above 250    Take a 15 gm snack of carbohydrate at bedtime before you go to sleep if your blood sugar is less than 100.    If you are going to fast after midnight for a test or procedure, ask your physician for instructions on how to reduce/decrease your insulin dose.    Call if blood sugar is less than 70 or consistently above 250  -Treating a low sugar by rule of 15  (15 gms of sugar every 15 min until sugar is more than 70) If you feel your sugar is low, test your sugar to be sure If your sugar is low (less than 70), then take 15 grams of a fast acting Carbohydrate (3-4 glucose tablets or glucose gel or 4 ounces of juice or regular soda) Recheck your sugar 15 min after treating low to make sure it is more than 70 If sugar is still less than 70, treat again with  15 grams of carbohydrate          Don't drive the hour of hypoglycemia  If unconscious/unable to eat or drink by mouth, use glucagon injection or nasal spray baqsimi and call 911. Can repeat again in 15 min if still unconscious.  Return in about 3 months (around 12/08/2023).   I have reviewed current medications, nurse's notes, allergies, vital signs, past medical and surgical history, family medical history, and social history for this encounter. Counseled patient on symptoms,  examination findings, lab findings, imaging results, treatment decisions and monitoring and prognosis. The patient understood the recommendations and agrees with the treatment plan. All questions regarding treatment plan were fully answered.  Brandon [place], MD  09/07/23    History of Present Illness SENDER [name] is a 52 y.o. year old male who presents for evaluation of Type II diabetes mellitus.  Brandon COLESON was first diagnosed in 2019.   Diabetes education +  Home diabetes regimen: Semglee 24 units every day  Glimepiride 2 mg every day   COMPLICATIONS -  MI/Stroke -  retinopathy +  neuropathy -  nephropathy  BLOOD SUGAR DATA Checks fasting BG, ranges 130-184   Physical Exam  BP 115/80   Pulse 94   Ht 6\' 1"  (1.854 m)   Wt 183 lb (83 kg)   SpO2 97%   BMI 24.14 kg/m    Constitutional: well developed, well nourished Head: normocephalic, atraumatic Eyes: sclera anicteric, no redness Neck: supple Lungs: normal respiratory effort Neurology: alert and oriented Skin: dry, no appreciable rashes Musculoskeletal: no appreciable defects Psychiatric: normal mood and affect Diabetic Foot Exam - Simple   No data filed      Current Medications Patient's Medications  New Prescriptions   EMPAGLIFLOZIN (JARDIANCE) 10 MG TABS TABLET    Take 1 tablet (10 mg total) by mouth daily before breakfast.  Previous Medications   ATORVASTATIN (LIPITOR) 10 MG TABLET    Take 10 mg by mouth daily.   B COMPLEX-VITAMIN C-FOLIC ACID (NEPHRO-VITE) 0.8 MG TABS TABLET    Take 1 tablet by mouth at bedtime.   GLIMEPIRIDE (AMARYL) 2 MG TABLET    Take 2 mg by mouth daily with breakfast.   VITAMIN D-VITAMIN K (VITAMIN K2-VITAMIN D3 PO)    Take 12 tablets by mouth.  Modified Medications   Modified Medication Previous Medication   INSULIN GLARGINE (SEMGLEE) 100 UNIT/ML INJECTION insulin glargine (SEMGLEE) 100 UNIT/ML injection      Inject 0.28 mLs (28 Units total) into the skin daily.     Inject 24 Units into the skin daily.  Discontinued Medications   ASPIRIN EC 325 MG TABLET    Take 1 tablet (325 mg total) by mouth daily.   GLIPIZIDE (GLUCOTROL) 5 MG TABLET    Take 5 mg by mouth 2 (two) times daily.   HYDROCODONE-ACETAMINOPHEN (NORCO/VICODIN) 5-325 MG TABLET    Take 1 tablet by mouth every 4 (four) hours as needed for moderate pain ((score 4 to 6)).   SYNJARDY 12.5-500 MG TABS    Take 1 tablet by mouth 2 (two) times daily with a meal.    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  Social History Social History   Socioeconomic History   Marital status: Married    Spouse name: Not on file   Number of children: Not on file   Years of education: Not on file   Highest education level: Not on file  Occupational History   Not on file  Tobacco Use   Smoking status: Former    Current packs/day: 1.00    Average packs/day: 1 pack/day for  17.0 years (17.0 ttl pk-yrs)    Types: Cigarettes   Smokeless tobacco: Former    Types: Chew   Tobacco comments:    QUIT 2005  Vaping Use   Vaping status: Never Used  Substance and Sexual Activity   Alcohol use: Not Currently   Drug use: No   Sexual activity: Not on file  Other Topics Concern   Not on file  Social History Narrative   Not on file   Social Determinants of Health   Financial Resource Strain: Not on file  Food Insecurity: Not on file  Transportation Needs: Not on file  Physical Activity: Not on file  Stress: Not on file  Social Connections: Not on file  Intimate Partner Violence: Not on file    Lab Results  Component Value Date   HGBA1C 8.8 (H) 09/02/2023   Lab Results  Component Value Date   CHOL 144 09/02/2023   Lab Results  Component Value Date   HDL 49.80 09/02/2023   Lab Results  Component Value Date   LDLCALC 78 09/02/2023   Lab Results  Component Value Date   TRIG 81.0 09/02/2023   Lab Results  Component Value Date   CHOLHDL 3 09/02/2023   Lab Results  Component Value Date   CREATININE 0.67 09/02/2023   Lab Results  Component Value Date   GFR 108.52 09/02/2023   Lab Results  Component Value Date   MICROALBUR 0.7 09/02/2023      Component Value Date/Time   NA 139 09/02/2023 0857   K 4.1 09/02/2023 0857   CL 101 09/02/2023 0857   CO2 29 09/02/2023 0857   GLUCOSE 153 (H) 09/02/2023 0857   BUN 21 09/02/2023 0857   CREATININE 0.67 09/02/2023 0857   CALCIUM 9.7 09/02/2023 0857   PROT 7.3 09/02/2023 0857   ALBUMIN 4.6 09/02/2023 0857   AST 21 09/02/2023 0857   ALT 23 09/02/2023 0857   ALKPHOS 57 09/02/2023 0857   BILITOT 0.5 09/02/2023 0857   GFRNONAA >60 04/08/2018 0904   GFRAA >60 04/08/2018 0904      Latest Ref Rng & Units 09/02/2023  8:57 AM 04/08/2018    9:04 AM 12/23/2017   12:00 PM  BMP  Glucose 70 - 99 mg/dL 161  096  045   BUN 6 - 23 mg/dL 21  19  15    Creatinine 0.40 - 1.50 mg/dL 4.09  8.11  9.14    Sodium 135 - 145 mEq/L 139  139  134   Potassium 3.5 - 5.1 mEq/L 4.1  3.8  4.1   Chloride 96 - 112 mEq/L 101  103  97   CO2 19 - 32 mEq/L 29  27  25    Calcium 8.4 - 10.5 mg/dL 9.7  9.3  9.5        Component Value Date/Time   WBC 7.0 04/08/2018 0904   RBC 5.34 04/08/2018 0904   HGB 16.3 04/08/2018 0904   HCT 47.6 04/08/2018 0904   PLT 198 04/08/2018 0904   MCV 89.1 04/08/2018 0904   MCH 30.5 04/08/2018 0904   MCHC 34.2 04/08/2018 0904   RDW 12.3 04/08/2018 0904   LYMPHSABS 2.9 04/08/2018 0904   MONOABS 0.5 04/08/2018 0904   EOSABS 0.2 04/08/2018 0904   BASOSABS 0.0 04/08/2018 0904     Parts of this note may have been dictated using voice recognition software. There may be variances in spelling and vocabulary which are unintentional. Not all errors are proofread. Please notify the Thereasa Parkin if any discrepancies are noted or if the meaning of any statement is not clear.

## 2023-09-22 DIAGNOSIS — Z6824 Body mass index (BMI) 24.0-24.9, adult: Secondary | ICD-10-CM | POA: Diagnosis not present

## 2023-09-22 DIAGNOSIS — Z713 Dietary counseling and surveillance: Secondary | ICD-10-CM | POA: Diagnosis not present

## 2023-09-22 DIAGNOSIS — E119 Type 2 diabetes mellitus without complications: Secondary | ICD-10-CM | POA: Diagnosis not present

## 2023-10-06 DIAGNOSIS — E559 Vitamin D deficiency, unspecified: Secondary | ICD-10-CM | POA: Diagnosis not present

## 2023-10-06 DIAGNOSIS — E119 Type 2 diabetes mellitus without complications: Secondary | ICD-10-CM | POA: Diagnosis not present

## 2023-10-06 DIAGNOSIS — E785 Hyperlipidemia, unspecified: Secondary | ICD-10-CM | POA: Diagnosis not present

## 2023-10-06 DIAGNOSIS — Z6823 Body mass index (BMI) 23.0-23.9, adult: Secondary | ICD-10-CM | POA: Diagnosis not present

## 2023-11-05 ENCOUNTER — Ambulatory Visit (AMBULATORY_SURGERY_CENTER): Payer: BC Managed Care – PPO

## 2023-11-05 VITALS — Ht 73.0 in | Wt 185.0 lb

## 2023-11-05 DIAGNOSIS — Z1211 Encounter for screening for malignant neoplasm of colon: Secondary | ICD-10-CM

## 2023-11-05 MED ORDER — NA SULFATE-K SULFATE-MG SULF 17.5-3.13-1.6 GM/177ML PO SOLN: 1.0000 | Freq: Once | ORAL | 0 refills | Status: AC

## 2023-11-05 NOTE — Progress Notes (Signed)
 No egg or soy allergy known to patient  No issues known to pt with past sedation with any surgeries or procedures Patient denies ever being told they had issues or difficulty with intubation  No FH of Malignant Hyperthermia Pt is not on diet pills Pt is not on  home 02  Pt is not on blood thinners  Pt reports constipation intermittently  No dx A fib or A flutter Have any cardiac testing pending-- no  LOA: independent  Prep: suprep   PV competed with patient. Prep instructions sent via mychart. Goodrx coupon for [facility]  provided to use for price reduction if needed.

## 2023-11-09 ENCOUNTER — Encounter: Payer: Self-pay | Admitting: Gastroenterology

## 2023-11-10 ENCOUNTER — Encounter: Payer: Self-pay | Admitting: Certified Registered Nurse Anesthetist

## 2023-11-11 ENCOUNTER — Encounter: Payer: Self-pay | Admitting: Gastroenterology

## 2023-11-11 ENCOUNTER — Ambulatory Visit: Payer: BC Managed Care – PPO | Admitting: Gastroenterology

## 2023-11-11 VITALS — BP 126/81 | HR 78 | Temp 98.1°F | Resp 14 | Ht 73.0 in | Wt 193.0 lb

## 2023-11-11 DIAGNOSIS — Z1211 Encounter for screening for malignant neoplasm of colon: Secondary | ICD-10-CM | POA: Diagnosis not present

## 2023-11-11 DIAGNOSIS — K640 First degree hemorrhoids: Secondary | ICD-10-CM

## 2023-11-11 DIAGNOSIS — K5730 Diverticulosis of large intestine without perforation or abscess without bleeding: Secondary | ICD-10-CM

## 2023-11-11 MED ORDER — SODIUM CHLORIDE 0.9 % IV SOLN: 500.0000 mL | Freq: Once | INTRAVENOUS | Status: DC

## 2023-11-11 NOTE — Op Note (Signed)
 [facility] Patient Name: Brandon Knox Procedure Date: 11/11/2023 8:47 AM MRN: [id number] Endoscopist: Lynnie Bring , MD, [id number] Age: 53 Referring MD:  Date of Birth: 05-06-1971 Gender: Male Account #: [id number] Procedure:                Colonoscopy Indications:              Screening for colorectal malignant neoplasm Medicines:                Monitored Anesthesia Care Procedure:                Pre-Anesthesia Assessment:                           - Prior to the procedure, a History and Physical                            was performed, and patient medications and                            allergies were reviewed. The patient's tolerance of                            previous anesthesia was also reviewed. The risks                            and benefits of the procedure and the sedation                            options and risks were discussed with the patient.                            All questions were answered, and informed consent                            was obtained. Prior Anticoagulants: The patient has                            taken no anticoagulant or antiplatelet agents. ASA                            Grade Assessment: II - A patient with mild systemic                            disease. After reviewing the risks and benefits,                            the patient was deemed in satisfactory condition to                            undergo the procedure.                           After obtaining informed consent, the colonoscope  was passed under direct vision. Throughout the                            procedure, the patient's blood pressure, pulse, and                            oxygen saturations were monitored continuously. The                            Olympus Scope SN [id number] was introduced through the                            anus and advanced to the the cecum, identified by                            appendiceal  orifice and ileocecal valve. The                            colonoscopy was somewhat difficult due to poor                            bowel prep. Successful completion of the procedure                            was aided by lavage. Despite aggressive suctioning                            and aspiration, solid retained stool/vegetable                            material in the right side of the colon could not                            be cleared. Of note that small and flat lesions                            could have been missed. Overall, 70-75% of the                            colonic mucosa was visualized satisfactorily. The                            patient tolerated the procedure well. The quality                            of the bowel preparation was fair. The ileocecal                            valve, appendiceal orifice, and rectum were                            photographed. Scope In: 9:04:29 AM Scope Out: 9:19:27 AM Scope Withdrawal Time: 0 hours 8 minutes 23  seconds  Total Procedure Duration: 0 hours 14 minutes 58 seconds  Findings:                 A few small-mouthed diverticula were found in the                            sigmoid colon.                           Non-bleeding internal hemorrhoids were found during                            retroflexion. The hemorrhoids were small and Grade                            I (internal hemorrhoids that do not prolapse).                           The exam was otherwise without abnormality on                            direct and retroflexion views. Complications:            No immediate complications. Estimated Blood Loss:     Estimated blood loss: none. Impression:               - Preparation of the colon was fair.                           - Minimal sigmoid diverticulosis.                           - Non-bleeding internal hemorrhoids.                           - The examination was otherwise normal on direct                             and retroflexion views.                           - No specimens collected. Recommendation:           - Patient has a contact number available for                            emergencies. The signs and symptoms of potential                            delayed complications were discussed with the                            patient. Return to normal activities tomorrow.                            Written discharge instructions were provided to the  patient.                           - Resume previous diet.                           - Miralax  1 capful (17 grams) in 8 ounces of water                            PO daily.                           - Repeat colonoscopy in 1 year for screening                            purposes with a 2-day prep.                           - The findings and recommendations were discussed                            with the patient. Lynnie Bring, MD 11/11/2023 9:25:33 AM This report has been signed electronically.

## 2023-11-11 NOTE — Progress Notes (Signed)
 Report given to PACU, vss

## 2023-11-11 NOTE — Progress Notes (Signed)
 [facility] History and Physical   Primary Care Physician:  Pandora Therisa RAMAN, NP   Reason for Procedure:   CRC Screening  Plan:     colonoscopy     HPI: Brandon Knox is a 53 y.o. 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 Authorizing Provider  atorvastatin (LIPITOR) 10 MG tablet Take 10 mg by mouth daily.   Yes [provider]  b complex-vitamin c-folic acid (NEPHRO-VITE) 0.8 MG TABS tablet Take 1 tablet by mouth at bedtime.   Yes [provider]  CONTOUR NEXT TEST test strip 1 each by Other route as needed for other. 11/06/23  Yes [provider]  glimepiride  (AMARYL ) 2 MG tablet Take 2 mg by mouth daily with breakfast.   Yes [provider]  insulin  glargine (SEMGLEE ) 100 UNIT/ML injection Inject 0.28 mLs (28 Units total) into the skin daily. 09/07/23 12/06/23 Yes Motwani, Komal, MD  Multiple Vitamins-Iron (CHLORELLA PO) Take by mouth daily at 6 (six) AM.   Yes [provider]   Vitamin D-Vitamin K (VITAMIN K2-VITAMIN D3 PO) Take 12 tablets by mouth.   Yes [provider]  empagliflozin  (JARDIANCE ) 10 MG TABS tablet Take 1 tablet (10 mg total) by mouth daily before breakfast. Patient not taking: Reported on 11/05/2023 09/07/23   Motwani, Komal, MD    Current Outpatient Medications  Medication Sig Dispense Refill   atorvastatin (LIPITOR) 10 MG tablet Take 10 mg by mouth daily.     b complex-vitamin c-folic acid (NEPHRO-VITE) 0.8 MG TABS tablet Take 1 tablet by mouth at bedtime.     CONTOUR NEXT TEST test strip 1 each by Other route as needed for other.     glimepiride  (AMARYL ) 2 MG tablet Take 2 mg by mouth daily with breakfast.     insulin  glargine (SEMGLEE ) 100 UNIT/ML injection Inject 0.28 mLs (28 Units total) into the skin daily. 28 mL 2   Multiple Vitamins-Iron (CHLORELLA PO) Take by mouth daily at 6 (six) AM.     Vitamin D-Vitamin K (VITAMIN K2-VITAMIN D3 PO) Take 12 tablets by mouth.     empagliflozin  (JARDIANCE ) 10 MG TABS tablet Take 1 tablet (10 mg total) by mouth daily before breakfast. (Patient not taking: Reported on 11/05/2023) 30 tablet 4   No current facility-administered medications for this  visit.    Allergies as of 11/11/2023 - Review Complete 11/11/2023  Allergen Reaction Noted   Metformin  and related Diarrhea 09/07/2023    Family History  Problem Relation Age of Onset   Colon polyps Father    Colon cancer Neg Hx    Esophageal cancer Neg Hx    Rectal cancer Neg Hx    Stomach cancer Neg Hx     Social History   Socioeconomic History   Marital status: Married    Spouse name: Not on file   Number of children: Not on file   Years of education: Not on file   Highest education level: Not on file  Occupational History   Not on file  Tobacco Use   Smoking status: Former    Current packs/day: 1.00    Average packs/day: 1 pack/day for 17.0 years (17.0 ttl pk-yrs)    Types: Cigarettes   Smokeless tobacco: Former    Types: Chew    Tobacco comments:    QUIT 2005  Vaping Use   Vaping status: Never Used  Substance and Sexual Activity   Alcohol use: Not Currently   Drug use: No   Sexual activity: Not on file  Other Topics Concern   Not on file  Social History Narrative   Not on file   Social Drivers of Health   Financial Resource Strain: Not on file  Food Insecurity: Not on file  Transportation Needs: Not on file  Physical Activity: Not on file  Stress: Not on file  Social Connections: Not on file  Intimate Partner Violence: Not on file    Review of Systems: Positive for none All other review of systems negative except as mentioned in the HPI.  Physical Exam: Vital signs in last 24 hours: @VSRANGES @   General:   Alert,  Well-developed, well-nourished, pleasant and cooperative in NAD Lungs:  Clear throughout to auscultation.   Heart:  Regular rate and rhythm; no murmurs, clicks, rubs,  or gallops. Abdomen:  Soft, nontender and nondistended. Normal bowel sounds.   Neuro/Psych:  Alert and cooperative. Normal mood and affect. A and O x 3    No significant changes were identified.  The patient continues to be an appropriate candidate for the planned procedure and anesthesia.   Anselm Bring, MD. [facility] 11/11/2023 3:24 PM@

## 2023-11-11 NOTE — Patient Instructions (Signed)

## 2023-11-11 NOTE — Progress Notes (Signed)
 Pt's states no medical or surgical changes since previsit or office visit.

## 2023-11-12 ENCOUNTER — Telehealth: Payer: Self-pay | Admitting: *Deleted

## 2023-11-12 NOTE — Telephone Encounter (Signed)
 Left message on f/u call

## 2023-12-02 DIAGNOSIS — E119 Type 2 diabetes mellitus without complications: Secondary | ICD-10-CM | POA: Diagnosis not present

## 2023-12-02 DIAGNOSIS — Z713 Dietary counseling and surveillance: Secondary | ICD-10-CM | POA: Diagnosis not present

## 2023-12-08 ENCOUNTER — Encounter: Payer: Self-pay | Admitting: "Endocrinology

## 2023-12-08 ENCOUNTER — Ambulatory Visit (INDEPENDENT_AMBULATORY_CARE_PROVIDER_SITE_OTHER): Payer: BC Managed Care – PPO | Admitting: "Endocrinology

## 2023-12-08 VITALS — BP 126/80 | HR 97 | Resp 20 | Ht 73.0 in | Wt 183.2 lb

## 2023-12-08 DIAGNOSIS — E1165 Type 2 diabetes mellitus with hyperglycemia: Secondary | ICD-10-CM | POA: Diagnosis not present

## 2023-12-08 DIAGNOSIS — E7800 Pure hypercholesterolemia, unspecified: Secondary | ICD-10-CM | POA: Diagnosis not present

## 2023-12-08 DIAGNOSIS — Z7984 Long term (current) use of oral hypoglycemic drugs: Secondary | ICD-10-CM | POA: Diagnosis not present

## 2023-12-08 DIAGNOSIS — Z794 Long term (current) use of insulin: Secondary | ICD-10-CM | POA: Diagnosis not present

## 2023-12-08 LAB — POCT GLYCOSYLATED HEMOGLOBIN (HGB A1C): Hemoglobin A1C: 6.9 % — AB (ref 4.0–5.6)

## 2023-12-08 MED ORDER — DEXCOM G7 SENSOR MISC: 1.0000 | 1 refills | Status: DC

## 2023-12-08 MED ORDER — FREESTYLE LIBRE 3 PLUS SENSOR MISC: 3 refills | Status: DC

## 2023-12-08 MED ORDER — INSULIN GLARGINE 100 UNIT/ML ~~LOC~~ SOLN: 48.0000 [IU] | Freq: Every day | SUBCUTANEOUS | 1 refills | Status: DC

## 2023-12-08 NOTE — Progress Notes (Signed)
 Outpatient Endocrinology Note Brandon Birmingham, MD  12/08/23   Brandon Knox [birth year] [id number]  Referring Provider: Lucius Krabbe, NP Primary Care Provider: Pandora Therisa RAMAN, NP Reason for consultation: Subjective   Assessment & Plan  Diagnoses and all orders for this visit:  Uncontrolled type 2 diabetes mellitus with hyperglycemia (HCC) -     POCT glycosylated hemoglobin (Hb A1C)  Long term (current) use of oral hypoglycemic drugs  Long-term insulin  use (HCC)  Pure hypercholesterolemia  Other orders -     insulin  glargine (SEMGLEE ) 100 UNIT/ML injection; Inject 0.48 mLs (48 Units total) into the skin daily. -     Continuous Glucose Sensor (FREESTYLE LIBRE 3 PLUS SENSOR) MISC; Change sensor every 15 days. -     Continuous Glucose Sensor (DEXCOM G7 SENSOR) MISC; 1 Device by Does not apply route continuous.   Diabetes Type II complicated by neuropathy Lab Results  Component Value Date   GFR 108.52 09/02/2023   Hba1c goal less than 7, current Hba1c is  Lab Results  Component Value Date   HGBA1C 6.9 (A) 12/08/2023   Will recommend the following: Semglee  48 units every day  Glimepiride  2 mg every day   Patient is happy with the above regimen and would like to stick to it   No known contraindications/side effects to any of above medications  -Last LD and Tg are as follows: Lab Results  Component Value Date   LDLCALC 78 09/02/2023    Lab Results  Component Value Date   TRIG 81.0 09/02/2023   -atorvastatin 10 mg every day  -Follow low fat diet and exercise   -Blood pressure goal <140/90 - Microalbumin/creatinine goal is < 30 -Last MA/Cr is as follows: Lab Results  Component Value Date   MICROALBUR 0.7 09/02/2023   -not on ACE/ARB  -diet changes including salt restriction -limit eating outside -counseled BP targets per standards of diabetes care -uncontrolled blood pressure can lead to retinopathy, nephropathy and cardiovascular and  atherosclerotic heart disease  Reviewed and counseled on: -A1C target -Blood sugar targets -Complications of uncontrolled diabetes  -Checking blood sugar before meals and bedtime and bring log next visit -All medications with mechanism of action and side effects -Hypoglycemia management: rule of 15's, Glucagon Emergency Kit and medical alert ID -low-carb low-fat plate-method diet -At least 20 minutes of physical activity per day -Annual dilated retinal eye exam and foot exam -compliance and follow up needs -follow up as scheduled or earlier if problem gets worse  Call if blood sugar is less than 70 or consistently above 250    Take a 15 gm snack of carbohydrate at bedtime before you go to sleep if your blood sugar is less than 100.    If you are going to fast after midnight for a test or procedure, ask your physician for instructions on how to reduce/decrease your insulin  dose.    Call if blood sugar is less than 70 or consistently above 250  -Treating a low sugar by rule of 15  (15 gms of sugar every 15 min until sugar is more than 70) If you feel your sugar is low, test your sugar to be sure If your sugar is low (less than 70), then take 15 grams of a fast acting Carbohydrate (3-4 glucose tablets or glucose gel or 4 ounces of juice or regular soda) Recheck your sugar 15 min after treating low to make sure it is more than 70 If sugar is still less than 70, treat  again with 15 grams of carbohydrate          Don't drive the hour of hypoglycemia  If unconscious/unable to eat or drink by mouth, use glucagon injection or nasal spray baqsimi and call 911. Can repeat again in 15 min if still unconscious.  Return in about 3 months (around 03/06/2024).   I have reviewed current medications, nurse's notes, allergies, vital signs, past medical and surgical history, family medical history, and social history for this encounter. Counseled patient on symptoms, examination findings, lab findings,  imaging results, treatment decisions and monitoring and prognosis. The patient understood the recommendations and agrees with the treatment plan. All questions regarding treatment plan were fully answered.  Brandon Birmingham, MD  12/08/23    History of Present Illness Brandon Knox is a 53 y.o. year old male who presents for evaluation of Type II diabetes mellitus.  Brandon Knox was first diagnosed in 2019.   Diabetes education +  Home diabetes regimen: Semglee  46 units every day  Glimepiride  2 mg every day   COMPLICATIONS -  MI/Stroke -  retinopathy +  neuropathy -  nephropathy  BLOOD SUGAR DATA  CGM interpretation: At today's visit, we reviewed her CGM downloads. The full report is scanned in the media. Reviewing the CGM trends, BG are well controlled more than 50% of the time, with highs-very high rest fo the time. No low BG.  Physical Exam  BP 126/80 (BP Location: Left Arm, Patient Position: Sitting, Cuff Size: Large)   Pulse 97   Resp 20   Ht 6' 1 (1.854 m)   Wt 183 lb 3.2 oz (83.1 kg)   SpO2 97%   BMI 24.17 kg/m    Constitutional: well developed, well nourished Head: normocephalic, atraumatic Eyes: sclera anicteric, no redness Neck: supple Lungs: normal respiratory effort Neurology: alert and oriented Skin: dry, no appreciable rashes Musculoskeletal: no appreciable defects Psychiatric: normal mood and affect Diabetic Foot Exam - Simple   No data filed      Current Medications Patient's Medications  New Prescriptions   CONTINUOUS GLUCOSE SENSOR (DEXCOM G7 SENSOR) MISC    1 Device by Does not apply route continuous.   CONTINUOUS GLUCOSE SENSOR (FREESTYLE LIBRE 3 PLUS SENSOR) MISC    Change sensor every 15 days.  Previous Medications   ATORVASTATIN (LIPITOR) 10 MG TABLET    Take 10 mg by mouth daily.   B COMPLEX-VITAMIN C-FOLIC ACID (NEPHRO-VITE) 0.8 MG TABS TABLET    Take 1 tablet by mouth at bedtime.   CONTOUR NEXT TEST TEST STRIP    1 each by  Other route as needed for other.   EMPAGLIFLOZIN  (JARDIANCE ) 10 MG TABS TABLET    Take 1 tablet (10 mg total) by mouth daily before breakfast.   GLIMEPIRIDE  (AMARYL ) 2 MG TABLET    Take 2 mg by mouth daily with breakfast.   MULTIPLE VITAMINS-IRON (CHLORELLA PO)    Take by mouth daily at 6 (six) AM.   VITAMIN D-VITAMIN K (VITAMIN K2-VITAMIN D3 PO)    Take 12 tablets by mouth.  Modified Medications   Modified Medication Previous Medication   INSULIN  GLARGINE (SEMGLEE ) 100 UNIT/ML INJECTION insulin  glargine (SEMGLEE ) 100 UNIT/ML injection      Inject 0.48 mLs (48 Units total) into the skin daily.    Inject 0.28 mLs (28 Units total) into the skin daily.  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   Sleep apnea    TOLD AFTER 2001 SURGERY HAD SLEEP APNEA 45 LBS LIGHT NOW    Past Surgical History Past Surgical History:  Procedure Laterality Date   CYST REMOVED   2001   GANGLION CYST FROM RIGHT GREAT TOE   FOREGIN BODY REMOVAL  22 YRS AGO   RIGHT FORE ARM   LUMBAR LAMINECTOMY/DECOMPRESSION MICRODISCECTOMY Right 04/13/2018   Procedure: Decompression lumbar laminectomy L5-S1 right;  Surgeon: Heide Ingle, MD;  Location: [facility];  Service: Orthopedics;  Laterality: Right;   SURGERY LEFT LEG AND RIGHT ANKLE  2011   PLATES IN BOTH    Family History family history includes Colon polyps in his father.  Social History Social History   Socioeconomic History   Marital status: Married     Spouse name: Not on file   Number of children: Not on file   Years of education: Not on file   Highest education level: Not on file  Occupational History   Not on file  Tobacco Use   Smoking status: Former    Current packs/day: 1.00    Average packs/day: 1 pack/day for 17.0 years (17.0 ttl pk-yrs)    Types: Cigarettes   Smokeless tobacco: Former    Types: Chew   Tobacco comments:    QUIT 2005  Vaping Use   Vaping status: Never Used  Substance and Sexual Activity   Alcohol use: Not Currently   Drug use: No   Sexual activity: Not on file  Other Topics Concern   Not on file  Social History Narrative   Not on file   Social Drivers of Health   Financial Resource Strain: Not on file  Food Insecurity: Not on file  Transportation Needs: Not on file  Physical Activity: Not on file  Stress: Not on file  Social Connections: Not on file  Intimate Partner Violence: Not on file    Lab Results  Component Value Date   HGBA1C 6.9 (A) 12/08/2023   HGBA1C 8.8 (H) 09/02/2023   Lab Results  Component Value Date   CHOL 144 09/02/2023   Lab Results  Component Value Date   HDL 49.80 09/02/2023   Lab Results  Component Value Date   LDLCALC 78 09/02/2023   Lab Results  Component Value Date   TRIG 81.0 09/02/2023   Lab Results  Component Value Date   CHOLHDL 3 09/02/2023   Lab Results  Component Value Date   CREATININE 0.67 09/02/2023   Lab Results  Component Value Date   GFR 108.52 09/02/2023   Lab Results  Component Value Date   MICROALBUR 0.7 09/02/2023      Component Value Date/Time   NA 139 09/02/2023 0857   K 4.1 09/02/2023 0857   CL 101 09/02/2023 0857   CO2 29 09/02/2023 0857   GLUCOSE 153 (H) 09/02/2023 0857   BUN 21 09/02/2023 0857   CREATININE 0.67 09/02/2023 0857   CALCIUM 9.7 09/02/2023 0857  PROT 7.3 09/02/2023 0857   ALBUMIN 4.6 09/02/2023 0857   AST 21 09/02/2023 0857   ALT 23 09/02/2023 0857   ALKPHOS 57 09/02/2023 0857   BILITOT 0.5  09/02/2023 0857   GFRNONAA >60 04/08/2018 0904   GFRAA >60 04/08/2018 0904      Latest Ref Rng & Units 09/02/2023    8:57 AM 04/08/2018    9:04 AM 12/23/2017   12:00 PM  BMP  Glucose 70 - 99 mg/dL 846  883  688   BUN 6 - 23 mg/dL 21  19  15    Creatinine 0.40 - 1.50 mg/dL 9.32  9.19  9.34   Sodium 135 - 145 mEq/L 139  139  134   Potassium 3.5 - 5.1 mEq/L 4.1  3.8  4.1   Chloride 96 - 112 mEq/L 101  103  97   CO2 19 - 32 mEq/L 29  27  25    Calcium 8.4 - 10.5 mg/dL 9.7  9.3  9.5        Component Value Date/Time   WBC 7.0 04/08/2018 0904   RBC 5.34 04/08/2018 0904   HGB 16.3 04/08/2018 0904   HCT 47.6 04/08/2018 0904   PLT 198 04/08/2018 0904   MCV 89.1 04/08/2018 0904   MCH 30.5 04/08/2018 0904   MCHC 34.2 04/08/2018 0904   RDW 12.3 04/08/2018 0904   LYMPHSABS 2.9 04/08/2018 0904   MONOABS 0.5 04/08/2018 0904   EOSABS 0.2 04/08/2018 0904   BASOSABS 0.0 04/08/2018 0904     Parts of this note may have been dictated using voice recognition software. There may be variances in spelling and vocabulary which are unintentional. Not all errors are proofread. Please notify the [facility] if any discrepancies are noted or if the meaning of any statement is not clear.

## 2023-12-08 NOTE — Patient Instructions (Signed)

## 2024-01-11 DIAGNOSIS — E119 Type 2 diabetes mellitus without complications: Secondary | ICD-10-CM | POA: Diagnosis not present

## 2024-01-11 DIAGNOSIS — Z1331 Encounter for screening for depression: Secondary | ICD-10-CM | POA: Diagnosis not present

## 2024-01-11 DIAGNOSIS — E785 Hyperlipidemia, unspecified: Secondary | ICD-10-CM | POA: Diagnosis not present

## 2024-03-06 ENCOUNTER — Ambulatory Visit (INDEPENDENT_AMBULATORY_CARE_PROVIDER_SITE_OTHER): Payer: BC Managed Care – PPO | Admitting: "Endocrinology

## 2024-03-06 ENCOUNTER — Encounter: Payer: Self-pay | Admitting: "Endocrinology

## 2024-03-06 VITALS — BP 120/80 | HR 65 | Ht 73.0 in | Wt 187.0 lb

## 2024-03-06 DIAGNOSIS — Z7984 Long term (current) use of oral hypoglycemic drugs: Secondary | ICD-10-CM

## 2024-03-06 DIAGNOSIS — E7800 Pure hypercholesterolemia, unspecified: Secondary | ICD-10-CM

## 2024-03-06 DIAGNOSIS — E1165 Type 2 diabetes mellitus with hyperglycemia: Secondary | ICD-10-CM

## 2024-03-06 NOTE — Patient Instructions (Signed)

## 2024-03-06 NOTE — Progress Notes (Signed)
 Outpatient Endocrinology Note Jorge Newcomer, MD  03/06/24   Brandon Knox 1971/01/09 [id number]  Referring Provider: Barney Boozer, NP Primary Care Provider: Barney Boozer, NP Reason for consultation: Subjective   Assessment & Plan  Diagnoses and all orders for this visit:  Uncontrolled type 2 diabetes mellitus with hyperglycemia (HCC)  Long term (current) use of oral hypoglycemic drugs  Pure hypercholesterolemia   Diabetes Type II complicated by neuropathy Lab Results  Component Value Date   GFR 108.52 09/02/2023   Hba1c goal less than 7, current Hba1c is  Lab Results  Component Value Date   HGBA1C 6.9 (A) 12/08/2023   Will recommend the following: Glimepiride 2 mg bid with meals, can take up to 2 pills bid if blood sugar >150 Understands the risks of lows   Semglee  52 units every day-self stopped due to lows and stated that it was meant to be stopped  Patient is happy with the above regimen and would like to stick to it Discussed about metformin  XR, actos, januvia, rybelsus, basal and meal time insulin -patient is not interested, reports affordabiltiy issues with jardiance    No known contraindications/side effects to any of above medications  -Last LD and Tg are as follows: Lab Results  Component Value Date   LDLCALC 78 09/02/2023    Lab Results  Component Value Date   TRIG 81.0 09/02/2023   -atorvastatin 10 mg every day  -Follow low fat diet and exercise   -Blood pressure goal <140/90 - Microalbumin/creatinine goal is < 30 -Last MA/Cr is as follows: Lab Results  Component Value Date   MICROALBUR 0.7 09/02/2023   -not on ACE/ARB  -diet changes including salt restriction -limit eating outside -counseled BP targets per standards of diabetes care -uncontrolled blood pressure can lead to retinopathy, nephropathy and cardiovascular and atherosclerotic heart disease  Reviewed and counseled on: -A1C target -Blood sugar targets -Complications  of uncontrolled diabetes  -Checking blood sugar before meals and bedtime and bring log next visit -All medications with mechanism of action and side effects -Hypoglycemia management: rule of 15's, Glucagon Emergency Kit and medical alert ID -low-carb low-fat plate-method diet -At least 20 minutes of physical activity per day -Annual dilated retinal eye exam and foot exam -compliance and follow up needs -follow up as scheduled or earlier if problem gets worse  Call if blood sugar is less than 70 or consistently above 250    Take a 15 gm snack of carbohydrate at bedtime before you go to sleep if your blood sugar is less than 100.    If you are going to fast after midnight for a test or procedure, ask your physician for instructions on how to reduce/decrease your insulin  dose.    Call if blood sugar is less than 70 or consistently above 250  -Treating a low sugar by rule of 15  (15 gms of sugar every 15 min until sugar is more than 70) If you feel your sugar is low, test your sugar to be sure If your sugar is low (less than 70), then take 15 grams of a fast acting Carbohydrate (3-4 glucose tablets or glucose gel or 4 ounces of juice or regular soda) Recheck your sugar 15 min after treating low to make sure it is more than 70 If sugar is still less than 70, treat again with 15 grams of carbohydrate          Don't drive the hour of hypoglycemia  If unconscious/unable to eat or  drink by mouth, use glucagon injection or nasal spray baqsimi and call 911. Can repeat again in 15 min if still unconscious.  Return in about 4 weeks (around 04/03/2024).   I have reviewed current medications, nurse's notes, allergies, vital signs, past medical and surgical history, family medical history, and social history for this encounter. Counseled patient on symptoms, examination findings, lab findings, imaging results, treatment decisions and monitoring and prognosis. The patient understood the recommendations and  agrees with the treatment plan. All questions regarding treatment plan were fully answered.  Jorge Newcomer, MD  03/06/24    History of Present Illness Brandon Knox is a 53 y.o. year old male who presents for evaluation of Type II diabetes mellitus.  Brandon Knox was first diagnosed in 2019.   Diabetes education +  Home diabetes regimen: Semglee  48 units every day-self quit  Glimepiride 2 mg every day   COMPLICATIONS -  MI/Stroke -  retinopathy +  neuropathy -  nephropathy  BLOOD SUGAR DATA Did not bring meter Lowest 157-312  Physical Exam  BP 120/80   Pulse 65   Ht 6\' 1"  (1.854 m)   Wt 187 lb (84.8 kg)   SpO2 98%   BMI 24.67 kg/m    Constitutional: well developed, well nourished Head: normocephalic, atraumatic Eyes: sclera anicteric, no redness Neck: supple Lungs: normal respiratory effort Neurology: alert and oriented Skin: dry, no appreciable rashes Musculoskeletal: no appreciable defects Psychiatric: normal mood and affect Diabetic Foot Exam - Simple   No data filed      Current Medications Patient's Medications  New Prescriptions   No medications on file  Previous Medications   ATORVASTATIN (LIPITOR) 10 MG TABLET    Take 10 mg by mouth daily.   B COMPLEX-VITAMIN C-FOLIC ACID (NEPHRO-VITE) 0.8 MG TABS TABLET    Take 1 tablet by mouth at bedtime.   CONTINUOUS GLUCOSE SENSOR (DEXCOM G7 SENSOR) MISC    1 Device by Does not apply route continuous.   CONTINUOUS GLUCOSE SENSOR (FREESTYLE LIBRE 3 PLUS SENSOR) MISC    Change sensor every 15 days.   CONTOUR NEXT TEST TEST STRIP    1 each by Other route as needed for other.   EMPAGLIFLOZIN  (JARDIANCE ) 10 MG TABS TABLET    Take 1 tablet (10 mg total) by mouth daily before breakfast.   GLIMEPIRIDE (AMARYL) 2 MG TABLET    Take 2 mg by mouth daily with breakfast.   INSULIN  GLARGINE (SEMGLEE ) 100 UNIT/ML INJECTION    Inject 0.48 mLs (48 Units total) into the skin daily.   MULTIPLE VITAMINS-IRON (CHLORELLA  PO)    Take by mouth daily at 6 (six) AM.   VITAMIN D-VITAMIN K (VITAMIN K2-VITAMIN D3 PO)    Take 12 tablets by mouth.  Modified Medications   No medications on file  Discontinued Medications   No medications on file    Allergies Allergies  Allergen Reactions   Metformin  And Related Diarrhea    Past Medical History Past Medical History:  Diagnosis Date   Ankle fracture 2011   RIGHT   Arthritis    ANKLES   Chronic sinusitis    Complication of anesthesia 2001   TOLD BY ANESTHESIA AFTER 2001 SURGERY HAS SLEEP APNEA   Diabetes mellitus without complication (HCC)    type 2 dm    Hypertension    STOPPED HTN MEDS 2011 DUE TO COST   Leg fracture, left 2011   Lumbar disc herniation    L5 TO S 1  RIGHT   Sleep apnea    TOLD AFTER 2001 SURGERY HAD SLEEP APNEA 45 LBS LIGHT NOW    Past Surgical History Past Surgical History:  Procedure Laterality Date   CYST REMOVED   2001   GANGLION CYST FROM RIGHT GREAT TOE   FOREGIN BODY REMOVAL  22 YRS AGO   RIGHT FORE ARM   LUMBAR LAMINECTOMY/DECOMPRESSION MICRODISCECTOMY Right 04/13/2018   Procedure: Decompression lumbar laminectomy L5-S1 right;  Surgeon: Hazle Lites, MD;  Location: [facility];  Service: Orthopedics;  Laterality: Right;   SURGERY LEFT LEG AND RIGHT ANKLE  2011   PLATES IN BOTH    Family History family history includes Colon polyps in his father.  Social History Social History   Socioeconomic History   Marital status: Married    Spouse name: Not on file   Number of children: Not on file   Years of education: Not on file   Highest education level: Not on file  Occupational History   Not on file  Tobacco Use   Smoking status: Former    Current packs/day: 1.00    Average packs/day: 1 pack/day for 17.0 years (17.0 ttl pk-yrs)    Types: Cigarettes   Smokeless tobacco: Former    Types: Chew   Tobacco comments:    QUIT 2005  Vaping Use   Vaping status: Never Used  Substance and Sexual Activity   Alcohol use:  Not Currently   Drug use: No   Sexual activity: Not on file  Other Topics Concern   Not on file  Social History Narrative   Not on file   Social Drivers of Health   Financial Resource Strain: Not on file  Food Insecurity: Not on file  Transportation Needs: Not on file  Physical Activity: Not on file  Stress: Not on file  Social Connections: Not on file  Intimate Partner Violence: Not on file    Lab Results  Component Value Date   HGBA1C 6.9 (A) 12/08/2023   HGBA1C 8.8 (H) 09/02/2023   Lab Results  Component Value Date   CHOL 144 09/02/2023   Lab Results  Component Value Date   HDL 49.80 09/02/2023   Lab Results  Component Value Date   LDLCALC 78 09/02/2023   Lab Results  Component Value Date   TRIG 81.0 09/02/2023   Lab Results  Component Value Date   CHOLHDL 3 09/02/2023   Lab Results  Component Value Date   CREATININE 0.67 09/02/2023   Lab Results  Component Value Date   GFR 108.52 09/02/2023   Lab Results  Component Value Date   MICROALBUR 0.7 09/02/2023      Component Value Date/Time   NA 139 09/02/2023 0857   K 4.1 09/02/2023 0857   CL 101 09/02/2023 0857   CO2 29 09/02/2023 0857   GLUCOSE 153 (H) 09/02/2023 0857   BUN 21 09/02/2023 0857   CREATININE 0.67 09/02/2023 0857   CALCIUM 9.7 09/02/2023 0857   PROT 7.3 09/02/2023 0857   ALBUMIN 4.6 09/02/2023 0857   AST 21 09/02/2023 0857   ALT 23 09/02/2023 0857   ALKPHOS 57 09/02/2023 0857   BILITOT 0.5 09/02/2023 0857   GFRNONAA >60 04/08/2018 0904   GFRAA >60 04/08/2018 0904      Latest Ref Rng & Units 09/02/2023    8:57 AM 04/08/2018    9:04 AM 12/23/2017   12:00 PM  BMP  Glucose 70 - 99 mg/dL 161  096  045   BUN 6 -  23 mg/dL 21  19  15    Creatinine 0.40 - 1.50 mg/dL 1.61  0.96  0.45   Sodium 135 - 145 mEq/L 139  139  134   Potassium 3.5 - 5.1 mEq/L 4.1  3.8  4.1   Chloride 96 - 112 mEq/L 101  103  97   CO2 19 - 32 mEq/L 29  27  25    Calcium 8.4 - 10.5 mg/dL 9.7  9.3  9.5         Component Value Date/Time   WBC 7.0 04/08/2018 0904   RBC 5.34 04/08/2018 0904   HGB 16.3 04/08/2018 0904   HCT 47.6 04/08/2018 0904   PLT 198 04/08/2018 0904   MCV 89.1 04/08/2018 0904   MCH 30.5 04/08/2018 0904   MCHC 34.2 04/08/2018 0904   RDW 12.3 04/08/2018 0904   LYMPHSABS 2.9 04/08/2018 0904   MONOABS 0.5 04/08/2018 0904   EOSABS 0.2 04/08/2018 0904   BASOSABS 0.0 04/08/2018 0904     Parts of this note may have been dictated using voice recognition software. There may be variances in spelling and vocabulary which are unintentional. Not all errors are proofread. Please notify the [facility] if any discrepancies are noted or if the meaning of any statement is not clear.

## 2024-04-03 ENCOUNTER — Encounter: Payer: Self-pay | Admitting: "Endocrinology

## 2024-04-03 ENCOUNTER — Ambulatory Visit (INDEPENDENT_AMBULATORY_CARE_PROVIDER_SITE_OTHER): Admitting: "Endocrinology

## 2024-04-03 VITALS — BP 110/80 | HR 75 | Ht 73.0 in | Wt 187.0 lb

## 2024-04-03 DIAGNOSIS — Z7984 Long term (current) use of oral hypoglycemic drugs: Secondary | ICD-10-CM | POA: Diagnosis not present

## 2024-04-03 DIAGNOSIS — E7800 Pure hypercholesterolemia, unspecified: Secondary | ICD-10-CM | POA: Diagnosis not present

## 2024-04-03 DIAGNOSIS — E1165 Type 2 diabetes mellitus with hyperglycemia: Secondary | ICD-10-CM

## 2024-04-03 LAB — POCT GLYCOSYLATED HEMOGLOBIN (HGB A1C): Hemoglobin A1C: 9.2 % — AB (ref 4.0–5.6)

## 2024-04-03 MED ORDER — LANCETS MISC. MISC: 1.0000 | Freq: Three times a day (TID) | 11 refills | Status: AC

## 2024-04-03 MED ORDER — BLOOD GLUCOSE TEST VI STRP: 1.0000 | ORAL_STRIP | Freq: Three times a day (TID) | 11 refills | Status: AC

## 2024-04-03 MED ORDER — PIOGLITAZONE HCL 15 MG PO TABS: 15.0000 mg | ORAL_TABLET | Freq: Every day | ORAL | 0 refills | Status: DC

## 2024-04-03 NOTE — Patient Instructions (Signed)

## 2024-04-03 NOTE — Progress Notes (Signed)
 Outpatient Endocrinology Note Brandon Newcomer, MD  04/03/24   Brandon Knox Apr 28, 1971 [id number]  Referring Provider: Barney Boozer, NP Primary Care Provider: Barney Boozer, NP Reason for consultation: Subjective   Assessment & Plan  Diagnoses and all orders for this visit:  Uncontrolled type 2 diabetes mellitus with hyperglycemia (HCC) -     POCT glycosylated hemoglobin (Hb A1C)  Long term (current) use of oral hypoglycemic drugs  Pure hypercholesterolemia  Other orders -     pioglitazone (ACTOS) 15 MG tablet; Take 1 tablet (15 mg total) by mouth daily. -     Glucose Blood (BLOOD GLUCOSE TEST STRIPS) STRP; 1 each by In Vitro route in the morning, at noon, and at bedtime. May substitute to any manufacturer covered by patient's insurance. -     Lancets Misc. MISC; 1 each by Does not apply route in the morning, at noon, and at bedtime. May substitute to any manufacturer covered by patient's insurance.    Diabetes Type II complicated by neuropathy Lab Results  Component Value Date   GFR 108.52 09/02/2023   Hba1c goal less than 7, current Hba1c is  Lab Results  Component Value Date   HGBA1C 9.2 (A) 04/03/2024   Will recommend the following: Glimepiride 2 mg two pills bid with meals Start actos 15 mg every day  Understands the risks of lows   No history of heart attack/heart failure/urinary bladder cancer  Previously, Patient reported he is happy with the above regimen and would like to stick to glimepiride only  Semglee  48 units every day-self stopped due to lows and stated that it was meant to be stopped  Discussed about metformin  XR, actos, januvia, rybelsus, basal and meal time insulin -patient is not interested, reports affordabiltiy issues with jardiance    No known contraindications/side effects to any of above medications  -Last LD and Tg are as follows: Lab Results  Component Value Date   LDLCALC 78 09/02/2023    Lab Results  Component Value Date    TRIG 81.0 09/02/2023   -atorvastatin 10 mg every day  -Follow low fat diet and exercise   -Blood pressure goal <140/90 - Microalbumin/creatinine goal is < 30 -Last MA/Cr is as follows: Lab Results  Component Value Date   MICROALBUR 0.7 09/02/2023   -not on ACE/ARB  -diet changes including salt restriction -limit eating outside -counseled BP targets per standards of diabetes care -uncontrolled blood pressure can lead to retinopathy, nephropathy and cardiovascular and atherosclerotic heart disease  Reviewed and counseled on: -A1C target -Blood sugar targets -Complications of uncontrolled diabetes  -Checking blood sugar before meals and bedtime and bring log next visit -All medications with mechanism of action and side effects -Hypoglycemia management: rule of 15's, Glucagon Emergency Kit and medical alert ID -low-carb low-fat plate-method diet -At least 20 minutes of physical activity per day -Annual dilated retinal eye exam and foot exam -compliance and follow up needs -follow up as scheduled or earlier if problem gets worse  Call if blood sugar is less than 70 or consistently above 250    Take a 15 gm snack of carbohydrate at bedtime before you go to sleep if your blood sugar is less than 100.    If you are going to fast after midnight for a test or procedure, ask your physician for instructions on how to reduce/decrease your insulin  dose.    Call if blood sugar is less than 70 or consistently above 250  -Treating a low sugar by  rule of 15  (15 gms of sugar every 15 min until sugar is more than 70) If you feel your sugar is low, test your sugar to be sure If your sugar is low (less than 70), then take 15 grams of a fast acting Carbohydrate (3-4 glucose tablets or glucose gel or 4 ounces of juice or regular soda) Recheck your sugar 15 min after treating low to make sure it is more than 70 If sugar is still less than 70, treat again with 15 grams of carbohydrate           Don't drive the hour of hypoglycemia  If unconscious/unable to eat or drink by mouth, use glucagon injection or nasal spray baqsimi and call 911. Can repeat again in 15 min if still unconscious.  Return in about 3 months (around 07/04/2024).   I have reviewed current medications, nurse's notes, allergies, vital signs, past medical and surgical history, family medical history, and social history for this encounter. Counseled patient on symptoms, examination findings, lab findings, imaging results, treatment decisions and monitoring and prognosis. The patient understood the recommendations and agrees with the treatment plan. All questions regarding treatment plan were fully answered.  Brandon Newcomer, MD  04/03/24    History of Present Illness Brandon Knox is a 53 y.o. year old male who presents for follow up  of Type II diabetes mellitus.  Brandon Knox was first diagnosed in 2019.   Diabetes education +  Home diabetes regimen: Glimepiride 2 mg two pills bid with meals  Semglee  48 units every day-self quit   COMPLICATIONS -  MI/Stroke -  retinopathy +  neuropathy -  nephropathy  BLOOD SUGAR DATA Lowest 159-294  Physical Exam  BP 110/80   Pulse 75   Ht 6\' 1"  (1.854 m)   Wt 187 lb (84.8 kg)   SpO2 98%   BMI 24.67 kg/m    Constitutional: well developed, well nourished Head: normocephalic, atraumatic Eyes: sclera anicteric, no redness Neck: supple Lungs: normal respiratory effort Neurology: alert and oriented Skin: dry, no appreciable rashes Musculoskeletal: no appreciable defects Psychiatric: normal mood and affect Diabetic Foot Exam - Simple   No data filed      Current Medications Patient's Medications  New Prescriptions   GLUCOSE BLOOD (BLOOD GLUCOSE TEST STRIPS) STRP    1 each by In Vitro route in the morning, at noon, and at bedtime. May substitute to any manufacturer covered by patient's insurance.   LANCETS MISC. MISC    1 each by Does not apply  route in the morning, at noon, and at bedtime. May substitute to any manufacturer covered by patient's insurance.   PIOGLITAZONE (ACTOS) 15 MG TABLET    Take 1 tablet (15 mg total) by mouth daily.  Previous Medications   ATORVASTATIN (LIPITOR) 10 MG TABLET    Take 10 mg by mouth daily.   B COMPLEX-VITAMIN C-FOLIC ACID (NEPHRO-VITE) 0.8 MG TABS TABLET    Take 1 tablet by mouth at bedtime.   CONTINUOUS GLUCOSE SENSOR (DEXCOM G7 SENSOR) MISC    1 Device by Does not apply route continuous.   CONTINUOUS GLUCOSE SENSOR (FREESTYLE LIBRE 3 PLUS SENSOR) MISC    Change sensor every 15 days.   CONTOUR NEXT TEST TEST STRIP    1 each by Other route as needed for other.   EMPAGLIFLOZIN  (JARDIANCE ) 10 MG TABS TABLET    Take 1 tablet (10 mg total) by mouth daily before breakfast.   GLIMEPIRIDE (AMARYL) 2 MG  TABLET    Take 2 mg by mouth daily with breakfast.   INSULIN  GLARGINE (SEMGLEE ) 100 UNIT/ML INJECTION    Inject 0.48 mLs (48 Units total) into the skin daily.   MULTIPLE VITAMINS-IRON (CHLORELLA PO)    Take by mouth daily at 6 (six) AM.   VITAMIN D-VITAMIN K (VITAMIN K2-VITAMIN D3 PO)    Take 12 tablets by mouth.  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   Sleep apnea    TOLD AFTER 2001 SURGERY HAD SLEEP APNEA 45 LBS LIGHT NOW    Past Surgical History Past Surgical History:  Procedure Laterality Date   CYST REMOVED   2001   GANGLION CYST FROM RIGHT  GREAT TOE   FOREGIN BODY REMOVAL  22 YRS AGO   RIGHT FORE ARM   LUMBAR LAMINECTOMY/DECOMPRESSION MICRODISCECTOMY Right 04/13/2018   Procedure: Decompression lumbar laminectomy L5-S1 right;  Surgeon: Hazle Lites, MD;  Location: [facility];  Service: Orthopedics;  Laterality: Right;   SURGERY LEFT LEG AND RIGHT ANKLE  2011   PLATES IN BOTH    Family History family history includes Colon polyps in his father.  Social History Social History   Socioeconomic History   Marital status: Married    Spouse name: Not on file   Number of children: Not on file   Years of education: Not on file   Highest education level: Not on file  Occupational History   Not on file  Tobacco Use   Smoking status: Former    Current packs/day: 1.00    Average packs/day: 1 pack/day for 17.0 years (17.0 ttl pk-yrs)    Types: Cigarettes   Smokeless tobacco: Former    Types: Chew   Tobacco comments:    QUIT 2005  Vaping Use   Vaping status: Never Used  Substance and Sexual Activity   Alcohol use: Not Currently   Drug use: No   Sexual activity: Not on file  Other Topics Concern   Not on file  Social History Narrative   Not on file   Social Drivers of Health   Financial Resource Strain: Not on file  Food Insecurity: Not on file  Transportation Needs: Not on file  Physical Activity: Not on file  Stress: Not on file  Social Connections: Not on file  Intimate Partner Violence: Not on file    Lab Results  Component Value Date   HGBA1C 9.2 (A) 04/03/2024   HGBA1C 6.9 (A) 12/08/2023   HGBA1C 8.8 (H) 09/02/2023   Lab Results  Component Value Date   CHOL 144 09/02/2023   Lab Results  Component Value Date   HDL 49.80 09/02/2023   Lab Results  Component Value Date   LDLCALC 78 09/02/2023   Lab Results  Component Value Date   TRIG 81.0 09/02/2023   Lab  Results  Component Value Date   CHOLHDL 3 09/02/2023   Lab Results  Component Value Date   CREATININE 0.67 09/02/2023   Lab Results   Component Value Date   GFR 108.52 09/02/2023   Lab Results  Component Value Date   MICROALBUR 0.7 09/02/2023      Component Value Date/Time   NA 139 09/02/2023 0857   K 4.1 09/02/2023 0857   CL 101 09/02/2023 0857   CO2 29 09/02/2023 0857   GLUCOSE 153 (H) 09/02/2023 0857   BUN 21 09/02/2023 0857   CREATININE 0.67 09/02/2023 0857   CALCIUM 9.7 09/02/2023 0857   PROT 7.3 09/02/2023 0857   ALBUMIN 4.6 09/02/2023 0857   AST 21 09/02/2023 0857   ALT 23 09/02/2023 0857   ALKPHOS 57 09/02/2023 0857   BILITOT 0.5 09/02/2023 0857   GFRNONAA >60 04/08/2018 0904   GFRAA >60 04/08/2018 0904      Latest Ref Rng & Units 09/02/2023    8:57 AM 04/08/2018    9:04 AM 12/23/2017   12:00 PM  BMP  Glucose 70 - 99 mg/dL 086  578  469   BUN 6 - 23 mg/dL 21  19  15    Creatinine 0.40 - 1.50 mg/dL 6.29  5.28  4.13   Sodium 135 - 145 mEq/L 139  139  134   Potassium 3.5 - 5.1 mEq/L 4.1  3.8  4.1   Chloride 96 - 112 mEq/L 101  103  97   CO2 19 - 32 mEq/L 29  27  25    Calcium 8.4 - 10.5 mg/dL 9.7  9.3  9.5        Component Value Date/Time   WBC 7.0 04/08/2018 0904   RBC 5.34 04/08/2018 0904   HGB 16.3 04/08/2018 0904   HCT 47.6 04/08/2018 0904   PLT 198 04/08/2018 0904   MCV 89.1 04/08/2018 0904   MCH 30.5 04/08/2018 0904   MCHC 34.2 04/08/2018 0904   RDW 12.3 04/08/2018 0904   LYMPHSABS 2.9 04/08/2018 0904   MONOABS 0.5 04/08/2018 0904   EOSABS 0.2 04/08/2018 0904   BASOSABS 0.0 04/08/2018 0904     Parts of this note may have been dictated using voice recognition software. There may be variances in spelling and vocabulary which are unintentional. Not all errors are proofread. Please notify the [facility] if any discrepancies are noted or if the meaning of any statement is not clear.

## 2024-04-13 DIAGNOSIS — E559 Vitamin D deficiency, unspecified: Secondary | ICD-10-CM | POA: Diagnosis not present

## 2024-04-13 DIAGNOSIS — E785 Hyperlipidemia, unspecified: Secondary | ICD-10-CM | POA: Diagnosis not present

## 2024-04-13 DIAGNOSIS — E119 Type 2 diabetes mellitus without complications: Secondary | ICD-10-CM | POA: Diagnosis not present

## 2024-06-12 DIAGNOSIS — K219 Gastro-esophageal reflux disease without esophagitis: Secondary | ICD-10-CM | POA: Diagnosis not present

## 2024-06-12 DIAGNOSIS — E1310 Other specified diabetes mellitus with ketoacidosis without coma: Secondary | ICD-10-CM | POA: Diagnosis not present

## 2024-06-12 DIAGNOSIS — D72823 Leukemoid reaction: Secondary | ICD-10-CM | POA: Diagnosis not present

## 2024-06-12 DIAGNOSIS — I10 Essential (primary) hypertension: Secondary | ICD-10-CM | POA: Diagnosis not present

## 2024-06-12 DIAGNOSIS — R7989 Other specified abnormal findings of blood chemistry: Secondary | ICD-10-CM | POA: Diagnosis not present

## 2024-06-12 DIAGNOSIS — K449 Diaphragmatic hernia without obstruction or gangrene: Secondary | ICD-10-CM | POA: Diagnosis not present

## 2024-06-12 DIAGNOSIS — E860 Dehydration: Secondary | ICD-10-CM | POA: Diagnosis not present

## 2024-06-12 DIAGNOSIS — M19072 Primary osteoarthritis, left ankle and foot: Secondary | ICD-10-CM | POA: Diagnosis not present

## 2024-06-12 DIAGNOSIS — E1165 Type 2 diabetes mellitus with hyperglycemia: Secondary | ICD-10-CM | POA: Diagnosis not present

## 2024-06-12 DIAGNOSIS — Z79899 Other long term (current) drug therapy: Secondary | ICD-10-CM | POA: Diagnosis not present

## 2024-06-12 DIAGNOSIS — E7800 Pure hypercholesterolemia, unspecified: Secondary | ICD-10-CM | POA: Diagnosis not present

## 2024-06-12 DIAGNOSIS — Z794 Long term (current) use of insulin: Secondary | ICD-10-CM | POA: Diagnosis not present

## 2024-06-12 DIAGNOSIS — E8729 Other acidosis: Secondary | ICD-10-CM | POA: Diagnosis not present

## 2024-06-12 DIAGNOSIS — R9431 Abnormal electrocardiogram [ECG] [EKG]: Secondary | ICD-10-CM | POA: Diagnosis not present

## 2024-06-12 DIAGNOSIS — K2210 Ulcer of esophagus without bleeding: Secondary | ICD-10-CM | POA: Diagnosis not present

## 2024-06-12 DIAGNOSIS — M19071 Primary osteoarthritis, right ankle and foot: Secondary | ICD-10-CM | POA: Diagnosis not present

## 2024-06-12 DIAGNOSIS — M47816 Spondylosis without myelopathy or radiculopathy, lumbar region: Secondary | ICD-10-CM | POA: Diagnosis not present

## 2024-06-12 DIAGNOSIS — R1110 Vomiting, unspecified: Secondary | ICD-10-CM | POA: Diagnosis not present

## 2024-06-12 DIAGNOSIS — R112 Nausea with vomiting, unspecified: Secondary | ICD-10-CM | POA: Diagnosis not present

## 2024-06-12 DIAGNOSIS — Z7984 Long term (current) use of oral hypoglycemic drugs: Secondary | ICD-10-CM | POA: Diagnosis not present

## 2024-06-12 DIAGNOSIS — Z9109 Other allergy status, other than to drugs and biological substances: Secondary | ICD-10-CM | POA: Diagnosis not present

## 2024-06-12 DIAGNOSIS — Z87891 Personal history of nicotine dependence: Secondary | ICD-10-CM | POA: Diagnosis not present

## 2024-06-12 DIAGNOSIS — E876 Hypokalemia: Secondary | ICD-10-CM | POA: Diagnosis not present

## 2024-06-12 DIAGNOSIS — K2100 Gastro-esophageal reflux disease with esophagitis, without bleeding: Secondary | ICD-10-CM | POA: Diagnosis not present

## 2024-06-12 DIAGNOSIS — R0600 Dyspnea, unspecified: Secondary | ICD-10-CM | POA: Diagnosis not present

## 2024-06-13 DIAGNOSIS — R9431 Abnormal electrocardiogram [ECG] [EKG]: Secondary | ICD-10-CM | POA: Diagnosis not present

## 2024-06-16 ENCOUNTER — Encounter: Payer: Self-pay | Admitting: "Endocrinology

## 2024-06-21 ENCOUNTER — Encounter: Payer: Self-pay | Admitting: Internal Medicine

## 2024-06-29 ENCOUNTER — Other Ambulatory Visit: Payer: Self-pay | Admitting: "Endocrinology

## 2024-06-29 NOTE — Telephone Encounter (Signed)
 Requested Prescriptions   Pending Prescriptions Disp Refills   pioglitazone  (ACTOS ) 15 MG tablet [Pharmacy Med Name: PIOGLITAZONE  15MG  TABLETS] 90 tablet 0    Sig: TAKE 1 TABLET(15 MG) BY MOUTH DAILY

## 2024-07-05 DIAGNOSIS — E113413 Type 2 diabetes mellitus with severe nonproliferative diabetic retinopathy with macular edema, bilateral: Secondary | ICD-10-CM | POA: Diagnosis not present

## 2024-07-10 ENCOUNTER — Ambulatory Visit (INDEPENDENT_AMBULATORY_CARE_PROVIDER_SITE_OTHER): Admitting: "Endocrinology

## 2024-07-10 ENCOUNTER — Encounter: Payer: Self-pay | Admitting: "Endocrinology

## 2024-07-10 VITALS — BP 120/85 | HR 74 | Ht 73.0 in | Wt 179.0 lb

## 2024-07-10 DIAGNOSIS — Z794 Long term (current) use of insulin: Secondary | ICD-10-CM | POA: Diagnosis not present

## 2024-07-10 DIAGNOSIS — E1165 Type 2 diabetes mellitus with hyperglycemia: Secondary | ICD-10-CM

## 2024-07-10 DIAGNOSIS — Z7984 Long term (current) use of oral hypoglycemic drugs: Secondary | ICD-10-CM | POA: Diagnosis not present

## 2024-07-10 DIAGNOSIS — E7800 Pure hypercholesterolemia, unspecified: Secondary | ICD-10-CM | POA: Diagnosis not present

## 2024-07-10 LAB — POCT GLYCOSYLATED HEMOGLOBIN (HGB A1C): Hemoglobin A1C: 8.6 % — AB (ref 4.0–5.6)

## 2024-07-10 MED ORDER — INSULIN ASPART PROT & ASPART (70-30 MIX) 100 UNIT/ML ~~LOC~~ SUSP: SUBCUTANEOUS | 5 refills | Status: DC

## 2024-07-10 NOTE — Progress Notes (Signed)
 Outpatient Endocrinology Note Brandon Birmingham, MD  07/10/24  Brandon Knox 1971/07/10 [id number]  Referring Provider: Pandora Therisa RAMAN, NP Primary Care Provider: Pandora Therisa RAMAN, NP Reason for consultation: Subjective   Assessment & Plan  Diagnoses and all orders for this visit:  Uncontrolled type 2 diabetes mellitus with hyperglycemia (HCC) -     POCT glycosylated hemoglobin (Hb A1C)  Long term (current) use of oral hypoglycemic drugs  Long-term insulin  use (HCC)  Pure hypercholesterolemia  Other orders -     insulin  aspart protamine- aspart (NOVOLOG  MIX 70/30) (70-30) 100 UNIT/ML injection; 5 units twice a day 15 min before meals, + correction scale: max dose 50 units a day  Diabetes Type II complicated by neuropathy Lab Results  Component Value Date   GFR 108.52 09/02/2023   Hba1c goal less than 7, current Hba1c is  Lab Results  Component Value Date   HGBA1C 8.6 (A) 07/10/2024   Will recommend the following: Glimepiride 2 mg two pills bid with meals Actos  15 mg every day  Novolog  70/30: 5 units twice a day 15 min before meals. Increase by 1 unit every day until 2-hour-post-meal sugar is less than 150  Novolin R on correction scale for blood sugar >250   Understands the risks of lows   Had ONLY water, coffee and tea up to 100 hrs to fix diabetes, but on day 4 (06/2024) was admitted to ICU for Starvation Ketoacidosis. Discussed with patient the life threatening nature of this approach, patient understand and agreed to compliance with diabetes management with medicines   No history of heart attack/heart failure/urinary bladder cancer  Previously, Patient reported he is happy with the above regimen and would like to stick to glimepiride only  Semglee  52 units every day-self stopped due to lows and stated that it was meant to be stopped  Discussed about metformin  XR, actos , januvia, rybelsus, basal and meal time insulin -patient is not interested, reports  affordabiltiy issues with jardiance    No known contraindications/side effects to any of above medications  -Last LD and Tg are as follows: Lab Results  Component Value Date   LDLCALC 78 09/02/2023    Lab Results  Component Value Date   TRIG 81.0 09/02/2023   -atorvastatin 10 mg every day  -Follow low fat diet and exercise   -Blood pressure goal <140/90 - Microalbumin/creatinine goal is < 30 -Last MA/Cr is as follows: No results found for: MICROALBUR, [id number]  -not on ACE/ARB  -diet changes including salt restriction -limit eating outside -counseled BP targets per standards of diabetes care -uncontrolled blood pressure can lead to retinopathy, nephropathy and cardiovascular and atherosclerotic heart disease  Reviewed and counseled on: -A1C target -Blood sugar targets -Complications of uncontrolled diabetes  -Checking blood sugar before meals and bedtime and bring log next visit -All medications with mechanism of action and side effects -Hypoglycemia management: rule of 15's, Glucagon Emergency Kit and medical alert ID -low-carb low-fat plate-method diet -At least 20 minutes of physical activity per day -Annual dilated retinal eye exam and foot exam -compliance and follow up needs -follow up as scheduled or earlier if problem gets worse  Call if blood sugar is less than 70 or consistently above 250    Take a 15 gm snack of carbohydrate at bedtime before you go to sleep if your blood sugar is less than 100.    If you are going to fast after midnight for a test or procedure, ask your physician for instructions on  how to reduce/decrease your insulin  dose.    Call if blood sugar is less than 70 or consistently above 250  -Treating a low sugar by rule of 15  (15 gms of sugar every 15 min until sugar is more than 70) If you feel your sugar is low, test your sugar to be sure If your sugar is low (less than 70), then take 15 grams of a fast acting Carbohydrate (3-4  glucose tablets or glucose gel or 4 ounces of juice or regular soda) Recheck your sugar 15 min after treating low to make sure it is more than 70 If sugar is still less than 70, treat again with 15 grams of carbohydrate          Don't drive the hour of hypoglycemia  If unconscious/unable to eat or drink by mouth, use glucagon injection or nasal spray baqsimi and call 911. Can repeat again in 15 min if still unconscious.  Return in about 9 weeks (around 09/11/2024).   I have reviewed current medications, nurse's notes, allergies, vital signs, past medical and surgical history, family medical history, and social history for this encounter. Counseled patient on symptoms, examination findings, lab findings, imaging results, treatment decisions and monitoring and prognosis. The patient understood the recommendations and agrees with the treatment plan. All questions regarding treatment plan were fully answered.  Brandon Birmingham, MD  07/10/24    History of Present Illness Brandon Knox is a 53 y.o. year old male who presents for follow up  of Type II diabetes mellitus.  Brandon Knox was first diagnosed in 2019.   Diabetes education +  Home diabetes regimen: Novolin R on correction scale 3:50>150  Actos  15 mg every day   Glimepiride 2 mg two pills bid with meals Semglee  48 units every day-self quit   COMPLICATIONS -  MI/Stroke -  retinopathy +  neuropathy -  nephropathy  BLOOD SUGAR DATA Lowest 150-308  Physical Exam  BP 120/85   Pulse 74   Ht 6' 1 (1.854 m)   Wt 179 lb (81.2 kg)   SpO2 96%   BMI 23.62 kg/m    Constitutional: well developed, well nourished Head: normocephalic, atraumatic Eyes: sclera anicteric, no redness Neck: supple Lungs: normal respiratory effort Neurology: alert and oriented Skin: dry, no appreciable rashes Musculoskeletal: no appreciable defects Psychiatric: normal mood and affect Diabetic Foot Exam - Simple   No data filed       Current Medications Patient's Medications  New Prescriptions   INSULIN  ASPART PROTAMINE- ASPART (NOVOLOG  MIX 70/30) (70-30) 100 UNIT/ML INJECTION    5 units twice a day 15 min before meals, + correction scale: max dose 50 units a day  Previous Medications   ATORVASTATIN (LIPITOR) 10 MG TABLET    Take 10 mg by mouth daily.   B COMPLEX-VITAMIN C-FOLIC ACID (NEPHRO-VITE) 0.8 MG TABS TABLET    Take 1 tablet by mouth at bedtime.   CONTINUOUS GLUCOSE SENSOR (DEXCOM G7 SENSOR) MISC    1 Device by Does not apply route continuous.   CONTINUOUS GLUCOSE SENSOR (FREESTYLE LIBRE 3 PLUS SENSOR) MISC    Change sensor every 15 days.   CONTOUR NEXT TEST TEST STRIP    1 each by Other route as needed for other.   EMPAGLIFLOZIN  (JARDIANCE ) 10 MG TABS TABLET    Take 1 tablet (10 mg total) by mouth daily before breakfast.   GLIMEPIRIDE (AMARYL) 2 MG TABLET    Take 2 mg by mouth daily with breakfast.  GLUCOSE BLOOD (BLOOD GLUCOSE TEST STRIPS) STRP    1 each by In Vitro route in the morning, at noon, and at bedtime. May substitute to any manufacturer covered by patient's insurance.   INSULIN  GLARGINE (SEMGLEE ) 100 UNIT/ML INJECTION    Inject 0.48 mLs (48 Units total) into the skin daily.   MULTIPLE VITAMINS-IRON (CHLORELLA PO)    Take by mouth daily at 6 (six) AM.   NOVOLIN R FLEXPEN RELION 100 UNIT/ML FLEXPEN    as directed.   PIOGLITAZONE  (ACTOS ) 15 MG TABLET    TAKE 1 TABLET(15 MG) BY MOUTH DAILY   VITAMIN D-VITAMIN K (VITAMIN K2-VITAMIN D3 PO)    Take 12 tablets by mouth.  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   Sleep apnea    TOLD AFTER 2001 SURGERY HAD SLEEP APNEA 45 LBS LIGHT NOW    Past Surgical History Past Surgical History:  Procedure Laterality Date   CYST REMOVED   2001   GANGLION CYST FROM RIGHT GREAT TOE   FOREGIN BODY REMOVAL  22 YRS AGO   RIGHT FORE ARM   LUMBAR LAMINECTOMY/DECOMPRESSION MICRODISCECTOMY Right 04/13/2018   Procedure: Decompression lumbar laminectomy L5-S1 right;  Surgeon: Heide Ingle, MD;  Location: [facility];  Service: Orthopedics;  Laterality: Right;   SURGERY LEFT LEG AND RIGHT ANKLE  2011   PLATES IN BOTH    Family History family history includes Colon polyps in his father.  Social History Social History   Socioeconomic History   Marital status: Married    Spouse name: Not on file   Number of children: Not on file   Years of education: Not on file   Highest education level: Not on file  Occupational History   Not on file  Tobacco Use   Smoking status: Former    Current packs/day: 1.00    Average packs/day: 1 pack/day for 17.0 years (17.0 ttl pk-yrs)    Types: Cigarettes   Smokeless tobacco: Former    Types: Chew   Tobacco comments:    QUIT 2005  Vaping Use   Vaping status: Never Used  Substance and Sexual Activity   Alcohol use: Not Currently   Drug use: No   Sexual activity: Not on file  Other Topics Concern   Not on file  Social History Narrative   Not on file   Social Drivers of Health   Financial Resource Strain: Not on file  Food Insecurity: Not on file  Transportation Needs: Not on file  Physical Activity: Not on file  Stress: Not on file  Social Connections: Not on file  Intimate Partner Violence: Not on file    Lab Results  Component Value Date   HGBA1C 8.6 (A) 07/10/2024   HGBA1C 9.2 (A) 04/03/2024   HGBA1C 6.9 (A) 12/08/2023   Lab  Results  Component  Value Date   CHOL 144 09/02/2023   Lab Results  Component Value Date   HDL 49.80 09/02/2023   Lab Results  Component Value Date   LDLCALC 78 09/02/2023   Lab Results  Component Value Date   TRIG 81.0 09/02/2023   Lab Results  Component Value Date   CHOLHDL 3 09/02/2023   Lab Results  Component Value Date   CREATININE 0.67 09/02/2023   Lab Results  Component Value Date   GFR 108.52 09/02/2023   No results found for: MACKEY CURRENT     Component Value Date/Time   NA 139 09/02/2023 0857   K 4.1 09/02/2023 0857   CL 101 09/02/2023 0857   CO2 29 09/02/2023 0857   GLUCOSE 153 (H) 09/02/2023 0857   BUN 21 09/02/2023 0857   CREATININE 0.67 09/02/2023 0857   CALCIUM 9.7 09/02/2023 0857   PROT 7.3 09/02/2023 0857   ALBUMIN 4.6 09/02/2023 0857   AST 21 09/02/2023 0857   ALT 23 09/02/2023 0857   ALKPHOS 57 09/02/2023 0857   BILITOT 0.5 09/02/2023 0857   GFRNONAA >60 04/08/2018 0904   GFRAA >60 04/08/2018 0904      Latest Ref Rng & Units 09/02/2023    8:57 AM 04/08/2018    9:04 AM 12/23/2017   12:00 PM  BMP  Glucose 70 - 99 mg/dL 846  883  688   BUN 6 - 23 mg/dL 21  19  15    Creatinine 0.40 - 1.50 mg/dL 9.32  9.19  9.34   Sodium 135 - 145 mEq/L 139  139  134   Potassium 3.5 - 5.1 mEq/L 4.1  3.8  4.1   Chloride 96 - 112 mEq/L 101  103  97   CO2 19 - 32 mEq/L 29  27  25    Calcium 8.4 - 10.5 mg/dL 9.7  9.3  9.5        Component Value Date/Time   WBC 7.0 04/08/2018 0904   RBC 5.34 04/08/2018 0904   HGB 16.3 04/08/2018 0904   HCT 47.6 04/08/2018 0904   PLT 198 04/08/2018 0904   MCV 89.1 04/08/2018 0904   MCH 30.5 04/08/2018 0904   MCHC 34.2 04/08/2018 0904   RDW 12.3 04/08/2018 0904   LYMPHSABS 2.9 04/08/2018 0904   MONOABS 0.5 04/08/2018 0904   EOSABS 0.2 04/08/2018 0904   BASOSABS 0.0 04/08/2018 0904     Parts of this note may have been dictated using voice recognition software. There may be variances in spelling and vocabulary which are  unintentional. Not all errors are proofread. Please notify the [facility] if any discrepancies are noted or if the meaning of any statement is not clear.

## 2024-07-10 NOTE — Patient Instructions (Signed)
 Will recommend the following: Glimepiride 2 mg two pills bid with meals Actos  15 mg every day  Novolog  70/30: 5 units twice a day 15 min before meals. Increase by 1 unit every day until 2-hour-post-meal sugar is less than 150  Novolin R on correction scale for blood sugar >250    ___________    Goals of DM therapy:  Morning Fasting blood sugar: 80-140  Blood sugar before meals: 80-140 Bed time blood sugar: 100-150  A1C <7%, limited only by hypoglycemia  1.Diabetes medications and their side effects discussed, including hypoglycemia    2. Check blood glucose:  a) Always check blood sugars before driving. Please see below (under hypoglycemia) on how to manage b) Check a minimum of 3 times/day or more as needed when having symptoms of hypoglycemia.   c) Try to check blood glucose before sleeping/in the middle of the night to ensure that it is remaining stable and not dropping less than 100 d) Check blood glucose more often if sick  3. Diet: a) 3 meals per day schedule b: Restrict carbs to 60-70 grams (4 servings) per meal c) Colorful vegetables - 3 servings a day, and low sugar fruit 2 servings/day Plate control method: 1/4 plate protein, 1/4 starch, 1/2 green, yellow, or red vegetables d) Avoid carbohydrate snacks unless hypoglycemic episode, or increased physical activity  4. Regular exercise as tolerated, preferably 3 or more hours a week  5. Hypoglycemia: a)  Do not drive or operate machinery without first testing blood glucose to assure it is over 90 mg%, or if dizzy, lightheaded, not feeling normal, etc, or  if foot or leg is numb or weak. b)  If blood glucose less than 70, take four 5gm Glucose tabs or 15-30 gm Glucose gel.  Repeat every 15 min as needed until blood sugar is >100 mg/dl. If hypoglycemia persists then call 911.   6. Sick day management: a) Check blood glucose more often b) Continue usual therapy if blood sugars are elevated.   7. Contact the doctor  immediately if blood glucose is frequently <60 mg/dl, or an episode of severe hypoglycemia occurs (where someone had to give you glucose/  glucagon or if you passed out from a low blood glucose), or if blood glucose is persistently >350 mg/dl, for further management  8. A change in level of physical activity or exercise and a change in diet may also affect your blood sugar. Check blood sugars more often and call if needed.  Instructions: 1. Bring glucose meter, blood glucose records on every visit for review 2. Continue to follow up with primary care physician and other providers for medical care 3. Yearly eye  and foot exam 4. Please get blood work done prior to the next appointment

## 2024-07-11 DIAGNOSIS — L700 Acne vulgaris: Secondary | ICD-10-CM | POA: Diagnosis not present

## 2024-07-11 DIAGNOSIS — L739 Follicular disorder, unspecified: Secondary | ICD-10-CM | POA: Diagnosis not present

## 2024-07-11 DIAGNOSIS — D485 Neoplasm of uncertain behavior of skin: Secondary | ICD-10-CM | POA: Diagnosis not present

## 2024-07-11 DIAGNOSIS — L719 Rosacea, unspecified: Secondary | ICD-10-CM | POA: Diagnosis not present

## 2024-07-19 ENCOUNTER — Ambulatory Visit: Admitting: "Endocrinology

## 2024-07-20 DIAGNOSIS — H00019 Hordeolum externum unspecified eye, unspecified eyelid: Secondary | ICD-10-CM | POA: Diagnosis not present

## 2024-07-20 DIAGNOSIS — E119 Type 2 diabetes mellitus without complications: Secondary | ICD-10-CM | POA: Diagnosis not present

## 2024-07-20 DIAGNOSIS — Z6823 Body mass index (BMI) 23.0-23.9, adult: Secondary | ICD-10-CM | POA: Diagnosis not present

## 2024-07-20 DIAGNOSIS — E785 Hyperlipidemia, unspecified: Secondary | ICD-10-CM | POA: Diagnosis not present

## 2024-08-10 DIAGNOSIS — E113413 Type 2 diabetes mellitus with severe nonproliferative diabetic retinopathy with macular edema, bilateral: Secondary | ICD-10-CM | POA: Diagnosis not present

## 2024-08-22 DIAGNOSIS — L719 Rosacea, unspecified: Secondary | ICD-10-CM | POA: Diagnosis not present

## 2024-08-22 DIAGNOSIS — D225 Melanocytic nevi of trunk: Secondary | ICD-10-CM | POA: Diagnosis not present

## 2024-08-24 DIAGNOSIS — E113413 Type 2 diabetes mellitus with severe nonproliferative diabetic retinopathy with macular edema, bilateral: Secondary | ICD-10-CM | POA: Diagnosis not present

## 2024-09-14 NOTE — Progress Notes (Signed)
 JAYDAN MEIDINGER                                          MRN: [id number]   09/14/2024   The [facility] Quality Team Specialist reviewed this patient medical record for the purposes of chart review for care gap closure. The following were reviewed: chart review for care gap closure-glycemic status assessment.    [facility] Quality Team

## 2024-09-18 ENCOUNTER — Encounter: Payer: Self-pay | Admitting: "Endocrinology

## 2024-09-18 ENCOUNTER — Other Ambulatory Visit

## 2024-09-18 ENCOUNTER — Ambulatory Visit (INDEPENDENT_AMBULATORY_CARE_PROVIDER_SITE_OTHER): Admitting: "Endocrinology

## 2024-09-18 VITALS — BP 120/82 | HR 80 | Ht 73.0 in | Wt 194.0 lb

## 2024-09-18 DIAGNOSIS — E7800 Pure hypercholesterolemia, unspecified: Secondary | ICD-10-CM

## 2024-09-18 DIAGNOSIS — E1165 Type 2 diabetes mellitus with hyperglycemia: Secondary | ICD-10-CM | POA: Diagnosis not present

## 2024-09-18 DIAGNOSIS — Z7984 Long term (current) use of oral hypoglycemic drugs: Secondary | ICD-10-CM | POA: Diagnosis not present

## 2024-09-18 DIAGNOSIS — R6889 Other general symptoms and signs: Secondary | ICD-10-CM

## 2024-09-18 DIAGNOSIS — Z794 Long term (current) use of insulin: Secondary | ICD-10-CM | POA: Diagnosis not present

## 2024-09-18 LAB — POCT GLYCOSYLATED HEMOGLOBIN (HGB A1C): Hemoglobin A1C: 8.6 % — AB (ref 4.0–5.6)

## 2024-09-18 MED ORDER — DEXCOM G7 SENSOR MISC: 1.0000 | Status: AC | PRN

## 2024-09-18 MED ORDER — GLIMEPIRIDE 2 MG PO TABS
2.0000 mg | ORAL_TABLET | ORAL | 3 refills | Status: AC | PRN
Start: 2024-09-18 — End: ?

## 2024-09-18 MED ORDER — DEXCOM G7 SENSOR MISC: 1.0000 | 0 refills | Status: DC

## 2024-09-18 MED ORDER — INSULIN PEN NEEDLE 31G X 8 MM MISC: 1.0000 | 2 refills | Status: AC | PRN

## 2024-09-18 MED ORDER — DEXCOM G7 SENSOR MISC: 1.0000 | 3 refills | Status: AC

## 2024-09-18 NOTE — Patient Instructions (Signed)

## 2024-09-18 NOTE — Progress Notes (Signed)
 Outpatient Endocrinology Note Brandon Birmingham, MD  09/18/24  Brandon Knox 1971/04/15 [id number]  Referring Provider: Pandora Therisa RAMAN, NP Primary Care Provider: Pandora Therisa RAMAN, NP Reason for consultation: Subjective   Assessment & Plan  Diagnoses and all orders for this visit:  Uncontrolled type 2 diabetes mellitus with hyperglycemia (HCC) -     POCT glycosylated hemoglobin (Hb A1C) -     Continuous Glucose Sensor (DEXCOM G7 SENSOR) MISC; 1 Device by Does not apply route as needed (every 10 days). -     Comprehensive metabolic panel with GFR -     Lipid panel -     Microalbumin / creatinine urine ratio -     Insulin  Pen Needle 31G X 8 MM MISC; 1 Needle by Does not apply route as needed.  Long term (current) use of oral hypoglycemic drugs  Long-term insulin  use (HCC)  Pure hypercholesterolemia  Resistant to change  Other orders -     Discontinue: Continuous Glucose Sensor (DEXCOM G7 SENSOR) MISC; 1 Device by Does not apply route continuous. -     Continuous Glucose Sensor (DEXCOM G7 SENSOR) MISC; 1 Device by Does not apply route continuous. -     glimepiride (AMARYL) 2 MG tablet; Take 1 tablet (2 mg total) by mouth as needed (Every 12 hours IF blood sugar is more than 200).  Diabetes Type II complicated by neuropathy Lab Results  Component Value Date   GFR 108.52 09/02/2023   Hba1c goal less than 7, current Hba1c is  Lab Results  Component Value Date   HGBA1C 8.6 (A) 09/18/2024   Will recommend the following: Actos  15 mg every day  Novolog  70/30: 20 units twice a day 15 min before meals. Glimepiride 2 mg Q12 hours as needed for blood sugar 200  Cannot afford CGM, given Dexcom sample in office and for home  Understands the risks of lows   Had ONLY water, coffee and tea up to 100 hrs to fix diabetes, but on day 4 (06/2024) was admitted to ICU for Starvation Ketoacidosis. Discussed with patient the life threatening nature of this approach, patient understand  and agreed to compliance with diabetes management with medicines   No history of heart attack/heart failure/urinary bladder cancer  Previously, Patient reported he is happy with the above regimen and would like to stick to glimepiride only  Semglee  48 units every day-self stopped due to lows and stated that it was meant to be stopped  Discussed about metformin  XR, actos , januvia, rybelsus, basal and meal time insulin -patient is not interested, reports affordabiltiy issues with jardiance    No known contraindications/side effects to any of above medications  -Last LD and Tg are as follows: Lab Results  Component Value Date   LDLCALC 78 09/02/2023    Lab Results  Component Value Date   TRIG 81.0 09/02/2023   -atorvastatin 10 mg every day  -Follow low fat diet and exercise   -Blood pressure goal <140/90 - Microalbumin/creatinine goal is < 30 -Last MA/Cr is as follows: No results found for: MICROALBUR, MALB24HUR  -not on ACE/ARB  -diet changes including salt restriction -limit eating outside -counseled BP targets per standards of diabetes care -uncontrolled blood pressure can lead to retinopathy, nephropathy and cardiovascular and atherosclerotic heart disease  Reviewed and counseled on: -A1C target -Blood sugar targets -Complications of uncontrolled diabetes  -Checking blood sugar before meals and bedtime and bring log next visit -All medications with mechanism of action and side effects -Hypoglycemia management: rule  of 15's, Glucagon Emergency Kit and medical alert ID -low-carb low-fat plate-method diet -At least 20 minutes of physical activity per day -Annual dilated retinal eye exam and foot exam -compliance and follow up needs -follow up as scheduled or earlier if problem gets worse  Call if blood sugar is less than 70 or consistently above 250    Take a 15 gm snack of carbohydrate at bedtime before you go to sleep if your blood sugar is less than 100.    If you  are going to fast after midnight for a test or procedure, ask your physician for instructions on how to reduce/decrease your insulin  dose.    Call if blood sugar is less than 70 or consistently above 250  -Treating a low sugar by rule of 15  (15 gms of sugar every 15 min until sugar is more than 70) If you feel your sugar is low, test your sugar to be sure If your sugar is low (less than 70), then take 15 grams of a fast acting Carbohydrate (3-4 glucose tablets or glucose gel or 4 ounces of juice or regular soda) Recheck your sugar 15 min after treating low to make sure it is more than 70 If sugar is still less than 70, treat again with 15 grams of carbohydrate          Don't drive the hour of hypoglycemia  If unconscious/unable to eat or drink by mouth, use glucagon injection or nasal spray baqsimi and call 911. Can repeat again in 15 min if still unconscious.  Return in about 8 days (around 09/26/2024) for visit, labs today.   I have reviewed current medications, nurse's notes, allergies, vital signs, past medical and surgical history, family medical history, and social history for this encounter. Counseled patient on symptoms, examination findings, lab findings, imaging results, treatment decisions and monitoring and prognosis. The patient understood the recommendations and agrees with the treatment plan. All questions regarding treatment plan were fully answered.  Brandon Birmingham, MD  09/18/24    History of Present Illness Brandon Knox is a 53 y.o. year old male who presents for follow up  of Type II diabetes mellitus.  Brandon Knox was first diagnosed in 2019.   Diabetes education +  Home diabetes regimen:  Actos  15 mg every day   Glimepiride 2 mg two pills bid with meals-self stopped Novolin R on correction scale 3:50>150--self stopped and threw away per patient  Semglee  48 units every day-self quit   COMPLICATIONS -  MI/Stroke -  retinopathy +  neuropathy -   nephropathy  BLOOD SUGAR DATA Before lunch and before supper Lowest 143-420  Physical Exam  BP 120/82   Pulse 80   Ht 6' 1 (1.854 m)   Wt 194 lb (88 kg)   SpO2 99%   BMI 25.60 kg/m    Constitutional: well developed, well nourished Head: normocephalic, atraumatic Eyes: sclera anicteric, no redness Neck: supple Lungs: normal respiratory effort Neurology: alert and oriented Skin: dry, no appreciable rashes Musculoskeletal: no appreciable defects Psychiatric: normal mood and affect Diabetic Foot Exam - Simple   No data filed      Current Medications Patient's Medications  New Prescriptions   CONTINUOUS GLUCOSE SENSOR (DEXCOM G7 SENSOR) MISC    1 Device by Does not apply route continuous.   CONTINUOUS GLUCOSE SENSOR (DEXCOM G7 SENSOR) MISC    1 Device by Does not apply route as needed (every 10 days).   GLIMEPIRIDE (AMARYL) 2 MG  TABLET    Take 1 tablet (2 mg total) by mouth as needed (Every 12 hours IF blood sugar is more than 200).   INSULIN  PEN NEEDLE 31G X 8 MM MISC    1 Needle by Does not apply route as needed.  Previous Medications   ATORVASTATIN (LIPITOR) 10 MG TABLET    Take 10 mg by mouth daily.   B COMPLEX-VITAMIN C-FOLIC ACID (NEPHRO-VITE) 0.8 MG TABS TABLET    Take 1 tablet by mouth at bedtime.   CONTOUR NEXT TEST TEST STRIP    1 each by Other route as needed for other.   EMPAGLIFLOZIN  (JARDIANCE ) 10 MG TABS TABLET    Take 1 tablet (10 mg total) by mouth daily before breakfast.   GLIMEPIRIDE (AMARYL) 2 MG TABLET    Take 2 mg by mouth daily with breakfast.   INSULIN  ASPART PROTAMINE- ASPART (NOVOLOG  MIX 70/30) (70-30) 100 UNIT/ML INJECTION    5 units twice a day 15 min before meals, + correction scale: max dose 50 units a day   MULTIPLE VITAMINS-IRON (CHLORELLA PO)    Take by mouth daily at 6 (six) AM.   NOVOLIN R FLEXPEN RELION 100 UNIT/ML FLEXPEN    as directed.   PIOGLITAZONE  (ACTOS ) 15 MG TABLET    TAKE 1 TABLET(15 MG) BY MOUTH DAILY   VITAMIN D-VITAMIN K  (VITAMIN K2-VITAMIN D3 PO)    Take 12 tablets by mouth.  Modified Medications   No medications on file  Discontinued Medications   CONTINUOUS GLUCOSE SENSOR (DEXCOM G7 SENSOR) MISC    1 Device by Does not apply route continuous.   CONTINUOUS GLUCOSE SENSOR (FREESTYLE LIBRE 3 PLUS SENSOR) MISC    Change sensor every 15 days.   INSULIN  GLARGINE (SEMGLEE ) 100 UNIT/ML INJECTION    Inject 0.48 mLs (48 Units total) into the skin daily.    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   Sleep apnea    TOLD AFTER 2001 SURGERY HAD SLEEP APNEA 45 LBS LIGHT NOW    Past Surgical History Past Surgical History:  Procedure Laterality Date   CYST REMOVED   2001   GANGLION CYST FROM RIGHT GREAT TOE   FOREGIN BODY REMOVAL  22 YRS AGO   RIGHT FORE ARM   LUMBAR LAMINECTOMY/DECOMPRESSION MICRODISCECTOMY Right 04/13/2018   Procedure: Decompression lumbar laminectomy L5-S1 right;  Surgeon: Heide Ingle, MD;  Location: [facility];  Service: Orthopedics;  Laterality: Right;   SURGERY LEFT LEG AND RIGHT ANKLE  2011   PLATES IN BOTH    Family History family history includes Colon polyps in his father.  Social History Social History   Socioeconomic History   Marital status: Married    Spouse name: Not on file   Number of children: Not on file   Years of education: Not on file   Highest education level: Not on file  Occupational History   Not on file  Tobacco  Use   Smoking status: Former    Current packs/day: 1.00    Average packs/day: 1 pack/day for 17.0  years (17.0 ttl pk-yrs)    Types: Cigarettes   Smokeless tobacco: Former    Types: Chew   Tobacco comments:    QUIT 2005  Vaping Use   Vaping status: Never Used  Substance and Sexual Activity   Alcohol use: Not Currently   Drug use: No   Sexual activity: Not on file  Other Topics Concern   Not on file  Social History Narrative   Not on file   Social Drivers of Health   Financial Resource Strain: Not on file  Food Insecurity: Not on file  Transportation Needs: Not on file  Physical Activity: Not on file  Stress: Not on file  Social Connections: Not on file  Intimate Partner Violence: Not on file    Lab Results  Component Value Date   HGBA1C 8.6 (A) 09/18/2024   HGBA1C 8.6 (A) 07/10/2024   HGBA1C 9.2 (A) 04/03/2024   Lab Results  Component Value Date   CHOL 144 09/02/2023   Lab Results  Component Value Date   HDL 49.80 09/02/2023   Lab Results  Component Value Date   LDLCALC 78 09/02/2023   Lab Results  Component Value Date   TRIG 81.0 09/02/2023   Lab Results  Component Value Date   CHOLHDL 3 09/02/2023   Lab Results  Component Value Date   CREATININE 0.67 09/02/2023   Lab Results  Component Value Date   GFR 108.52 09/02/2023   No results found for: MACKEY CURRENT     Component Value Date/Time   NA 139 09/02/2023 0857   K 4.1 09/02/2023 0857   CL 101 09/02/2023 0857   CO2 29 09/02/2023 0857   GLUCOSE 153 (H) 09/02/2023 0857   BUN 21 09/02/2023 0857   CREATININE 0.67 09/02/2023 0857   CALCIUM 9.7 09/02/2023 0857   PROT 7.3 09/02/2023 0857   ALBUMIN 4.6 09/02/2023 0857   AST 21 09/02/2023 0857   ALT 23 09/02/2023 0857   ALKPHOS 57 09/02/2023 0857   BILITOT 0.5 09/02/2023 0857   GFRNONAA >60 04/08/2018 0904   GFRAA >60 04/08/2018 0904      Latest Ref Rng & Units 09/02/2023    8:57 AM 04/08/2018    9:04 AM 12/23/2017   12:00 PM  BMP  Glucose 70 - 99 mg/dL 846  883  688   BUN 6 - 23 mg/dL 21  19  15    Creatinine 0.40 - 1.50  mg/dL 9.32  9.19  9.34   Sodium 135 - 145 mEq/L 139  139  134   Potassium 3.5 - 5.1 mEq/L 4.1  3.8  4.1   Chloride 96 - 112 mEq/L 101  103  97   CO2 19 - 32 mEq/L 29  27  25    Calcium 8.4 - 10.5 mg/dL 9.7  9.3  9.5        Component Value Date/Time   WBC 7.0 04/08/2018 0904   RBC 5.34 04/08/2018 0904   HGB 16.3 04/08/2018 0904   HCT 47.6 04/08/2018 0904   PLT 198 04/08/2018 0904   MCV 89.1 04/08/2018 0904   MCH 30.5 04/08/2018 0904   MCHC 34.2 04/08/2018 0904   RDW 12.3 04/08/2018 0904   LYMPHSABS 2.9 04/08/2018 0904   MONOABS 0.5 04/08/2018 0904   EOSABS 0.2 04/08/2018 0904   BASOSABS 0.0 04/08/2018 0904     Parts of this  note may have been dictated using voice recognition software. There may be variances in spelling and vocabulary which are unintentional. Not all errors are proofread. Please notify the dino if any discrepancies are noted or if the meaning of any statement is not clear.

## 2024-09-19 LAB — COMPREHENSIVE METABOLIC PANEL WITH GFR
AG Ratio: 2 (calc) (ref 1.0–2.5)
ALT: 24 U/L (ref 9–46)
AST: 18 U/L (ref 10–35)
Albumin: 4.5 g/dL (ref 3.6–5.1)
Alkaline phosphatase (APISO): 51 U/L (ref 35–144)
BUN: 21 mg/dL (ref 7–25)
CO2: 29 mmol/L (ref 20–32)
Calcium: 10.2 mg/dL (ref 8.6–10.3)
Chloride: 102 mmol/L (ref 98–110)
Creat: 0.8 mg/dL (ref 0.70–1.30)
Globulin: 2.2 g/dL (ref 1.9–3.7)
Glucose, Bld: 147 mg/dL — ABNORMAL HIGH (ref 65–99)
Potassium: 4.5 mmol/L (ref 3.5–5.3)
Sodium: 140 mmol/L (ref 135–146)
Total Bilirubin: 0.5 mg/dL (ref 0.2–1.2)
Total Protein: 6.7 g/dL (ref 6.1–8.1)
eGFR: 106 mL/min/1.73m2 (ref >=60)

## 2024-09-19 LAB — LIPID PANEL
Cholesterol: 148 mg/dL (ref <200)
HDL: 50 mg/dL (ref >=40)
LDL Cholesterol (Calc): 82 mg/dL
Non-HDL Cholesterol (Calc): 98 mg/dL (ref <130)
Total CHOL/HDL Ratio: 3 (calc) (ref <5.0)
Triglycerides: 77 mg/dL (ref <150)

## 2024-09-19 LAB — MICROALBUMIN / CREATININE URINE RATIO
Creatinine, Urine: 85 mg/dL (ref 20–320)
Microalb Creat Ratio: 6 mg/g{creat} (ref <30)
Microalb, Ur: 0.5 mg/dL

## 2024-09-22 ENCOUNTER — Telehealth: Payer: Self-pay | Admitting: "Endocrinology

## 2024-09-22 ENCOUNTER — Telehealth: Payer: Self-pay | Admitting: Endocrinology

## 2024-09-22 DIAGNOSIS — E1165 Type 2 diabetes mellitus with hyperglycemia: Secondary | ICD-10-CM

## 2024-09-22 MED ORDER — INSULIN ASPART PROT & ASPART (70-30 MIX) 100 UNIT/ML ~~LOC~~ SUSP: SUBCUTANEOUS | 5 refills | Status: AC

## 2024-09-22 NOTE — Telephone Encounter (Signed)
 MEDICATION: insulin  aspart protamine- aspart (NOVOLOG  MIX 70/30) (70-30) 100 UNIT/ML injection   PHARMACY:  [facility] [phone] - , [place] - [address] (Ph: [phone])   HAS THE PATIENT CONTACTED THEIR PHARMACY?  Yes  LAST REFILL:  @@LASTREFILL @  IS THIS A 90 DAY SUPPLY : Yes  IS PATIENT OUT OF MEDICATION: No  IF NOT; HOW MUCH IS LEFT: Not Sure  LAST APPOINTMENT DATE: 09/18/2024  NEXT APPOINTMENT DATE:@11 /25/2025  DO WE HAVE YOUR PERMISSION TO LEAVE A DETAILED MESSAGE?: Yes  OTHER COMMENTS:    **Let patient know to contact pharmacy at the end of the day to make sure medication is ready. **  ** Please notify patient to allow 48-72 hours to process**  **Encourage patient to contact the pharmacy for refills or they can request refills through [facility]

## 2024-09-22 NOTE — Telephone Encounter (Signed)
 ERROR

## 2024-09-22 NOTE — Telephone Encounter (Signed)
 Requested Prescriptions   Signed Prescriptions Disp Refills   insulin  aspart protamine- aspart (NOVOLOG  MIX 70/30) (70-30) 100 UNIT/ML injection 15 mL 5    Sig: 5 units twice a day 15 min before meals, + correction scale: max dose 50 units a day    Authorizing Provider: DARTHA ERNST    Ordering User: ARLOA JEOFFREY SAILOR

## 2024-09-26 ENCOUNTER — Encounter: Payer: Self-pay | Admitting: "Endocrinology

## 2024-09-26 ENCOUNTER — Ambulatory Visit (INDEPENDENT_AMBULATORY_CARE_PROVIDER_SITE_OTHER): Admitting: "Endocrinology

## 2024-09-26 VITALS — BP 110/80 | HR 65 | Ht 73.0 in | Wt 195.0 lb

## 2024-09-26 DIAGNOSIS — Z7984 Long term (current) use of oral hypoglycemic drugs: Secondary | ICD-10-CM

## 2024-09-26 DIAGNOSIS — E1165 Type 2 diabetes mellitus with hyperglycemia: Secondary | ICD-10-CM

## 2024-09-26 DIAGNOSIS — E7800 Pure hypercholesterolemia, unspecified: Secondary | ICD-10-CM

## 2024-09-26 DIAGNOSIS — Z794 Long term (current) use of insulin: Secondary | ICD-10-CM | POA: Diagnosis not present

## 2024-09-26 MED ORDER — PIOGLITAZONE HCL 30 MG PO TABS: 30.0000 mg | ORAL_TABLET | Freq: Every day | ORAL | 0 refills | Status: AC

## 2024-09-26 NOTE — Progress Notes (Signed)
 Outpatient Endocrinology Note Brandon Birmingham, MD  09/26/24  Brandon Knox Dec 23, 1970 [id number]  Referring Provider: Pandora Therisa RAMAN, NP Primary Care Provider: Pandora Therisa RAMAN, NP Reason for consultation: Subjective   Assessment & Plan  Diagnoses and all orders for this visit:  Uncontrolled type 2 diabetes mellitus with hyperglycemia (HCC)  Long term (current) use of oral hypoglycemic drugs  Long-term insulin  use (HCC)  Pure hypercholesterolemia  Other orders -     pioglitazone  (ACTOS ) 30 MG tablet; Take 1 tablet (30 mg total) by mouth daily.   Diabetes Type II complicated by neuropathy Lab Results  Component Value Date   GFR 108.52 09/02/2023   Hba1c goal less than 7, current Hba1c is  Lab Results  Component Value Date   HGBA1C 8.6 (A) 09/18/2024   Will recommend the following: Pioglitazone  30 mg every day  Novolog  70/30: 22 units twice a day 15 min before meals. Glimepiride  2 mg Q12 hours as needed for blood sugar 200  Cannot afford CGM, given Dexcom sample in office and for home  Understands the risks of lows   Had ONLY water, coffee and tea up to 100 hrs to fix diabetes, but on day 4 (06/2024) was admitted to ICU for Starvation Ketoacidosis. Discussed with patient the life threatening nature of this approach, patient understand and agreed to compliance with diabetes management with medicines   No history of heart attack/heart failure/urinary bladder cancer  Previously, Patient reported he is happy with the above regimen and would like to stick to glimepiride  only  Semglee  48 units every day-self stopped due to lows and stated that it was meant to be stopped  Discussed about metformin  XR, actos , januvia, rybelsus, basal and meal time insulin -patient is not interested, reports affordabiltiy issues with [employer]iance    No known contraindications/side effects to any of above medications  -Last LD and Tg are as follows: Lab Results  Component Value Date    LDLCALC 82 09/18/2024    Lab Results  Component Value Date   TRIG 77 09/18/2024   -atorvastatin 10 mg every day  -Follow low fat diet and exercise   -Blood pressure goal <140/90 - Microalbumin/creatinine goal is < 30 -Last MA/Cr is as follows: Lab Results  Component Value Date   MICROALBUR 0.5 09/18/2024    -not on ACE/ARB  -diet changes including salt restriction -limit eating outside -counseled BP targets per standards of diabetes care -uncontrolled blood pressure can lead to retinopathy, nephropathy and cardiovascular and atherosclerotic heart disease  Reviewed and counseled on: -A1C target -Blood sugar targets -Complications of uncontrolled diabetes  -Checking blood sugar before meals and bedtime and bring log next visit -All medications with mechanism of action and side effects -Hypoglycemia management: rule of 15's, Glucagon Emergency Kit and medical alert ID -low-carb low-fat plate-method diet -At least 20 minutes of physical activity per day -Annual dilated retinal eye exam and foot exam -compliance and follow up needs -follow up as scheduled or earlier if problem gets worse  Call if blood sugar is less than 70 or consistently above 250    Take a 15 gm snack of carbohydrate at bedtime before you go to sleep if your blood sugar is less than 100.    If you are going to fast after midnight for a test or procedure, ask your physician for instructions on how to reduce/decrease your insulin  dose.    Call if blood sugar is less than 70 or consistently above 250  -Treating a low sugar  by rule of 15  (15 gms of sugar every 15 min until sugar is more than 70) If you feel your sugar is low, test your sugar to be sure If your sugar is low (less than 70), then take 15 grams of a fast acting Carbohydrate (3-4 glucose tablets or glucose gel or 4 ounces of juice or regular soda) Recheck your sugar 15 min after treating low to make sure it is more than 70 If sugar is still less  than 70, treat again with 15 grams of carbohydrate          Don't drive the hour of hypoglycemia  If unconscious/unable to eat or drink by mouth, use glucagon injection or nasal spray baqsimi and call 911. Can repeat again in 15 min if still unconscious.  Return in about 3 months (around 12/27/2024).   I have reviewed current medications, nurse's notes, allergies, vital signs, past medical and surgical history, family medical history, and social history for this encounter. Counseled patient on symptoms, examination findings, lab findings, imaging results, treatment decisions and monitoring and prognosis. The patient understood the recommendations and agrees with the treatment plan. All questions regarding treatment plan were fully answered.  Brandon Birmingham, MD  09/26/24    History of Present Illness Brandon Knox is a 53 y.o. year old male who presents for follow up  of Type II diabetes mellitus.  Brandon Knox was first diagnosed in 2019.   Diabetes education +  Home diabetes regimen:  Actos  15 mg every day   Glimepiride  2 mg two pills bid with meals-self stopped Novolin R on correction scale 3:50>150--self stopped and threw away per patient  Semglee  48 units every day-self quit   COMPLICATIONS -  MI/Stroke -  retinopathy +  neuropathy -  nephropathy  BLOOD SUGAR DATA CGM interpretation: At today's visit, we reviewed her CGM downloads. The full report is scanned in the media. Reviewing the CGM trends, BG are normal except a peak of highs in morning and mildly high sugars later at night.    Physical Exam  BP 110/80   Pulse 65   Ht 6' 1 (1.854 m)   Wt 195 lb (88.5 kg)   SpO2 99%   BMI 25.73 kg/m    Constitutional: well developed, well nourished Head: normocephalic, atraumatic Eyes: sclera anicteric, no redness Neck: supple Lungs: normal respiratory effort Neurology: alert and oriented Skin: dry, no appreciable rashes Musculoskeletal: no appreciable  defects Psychiatric: normal mood and affect Diabetic Foot Exam - Simple   No data filed      Current Medications Patient's Medications  New Prescriptions   PIOGLITAZONE  (ACTOS ) 30 MG TABLET    Take 1 tablet (30 mg total) by mouth daily.  Previous Medications   ATORVASTATIN (LIPITOR) 10 MG TABLET    Take 10 mg by mouth daily.   B COMPLEX-VITAMIN C-FOLIC ACID (NEPHRO-VITE) 0.8 MG TABS TABLET    Take 1 tablet by mouth at bedtime.   CONTINUOUS GLUCOSE SENSOR (DEXCOM G7 SENSOR) MISC    1 Device by Does not apply route continuous.   CONTINUOUS GLUCOSE SENSOR (DEXCOM G7 SENSOR) MISC    1 Device by Does not apply route as needed (every 10 days).   CONTOUR NEXT TEST TEST STRIP    1 each by Other route as needed for other.   EMPAGLIFLOZIN  (JARDIANCE ) 10 MG TABS TABLET    Take 1 tablet (10 mg total) by mouth daily before breakfast.   GLIMEPIRIDE  (AMARYL ) 2 MG TABLET  Take 2 mg by mouth daily with breakfast.   GLIMEPIRIDE  (AMARYL ) 2 MG TABLET    Take 1 tablet (2 mg total) by mouth as needed (Every 12 hours IF blood sugar is more than 200).   INSULIN  ASPART PROTAMINE- ASPART (NOVOLOG  MIX 70/30) (70-30) 100 UNIT/ML INJECTION    5 units twice a day 15 min before meals, + correction scale: max dose 50 units a day   INSULIN  PEN NEEDLE 31G X 8 MM MISC    1 Needle by Does not apply route as needed.   MULTIPLE VITAMINS-IRON (CHLORELLA PO)    Take by mouth daily at 6 (six) AM.   NOVOLIN R FLEXPEN RELION 100 UNIT/ML FLEXPEN    as directed.   VITAMIN D-VITAMIN K (VITAMIN K2-VITAMIN D3 PO)    Take 12 tablets by mouth.  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   Sleep apnea    TOLD AFTER 2001 SURGERY HAD SLEEP APNEA 45 LBS LIGHT NOW    Past Surgical History Past Surgical History:  Procedure Laterality Date   CYST REMOVED   2001   GANGLION CYST FROM RIGHT GREAT TOE   FOREGIN BODY REMOVAL  22 YRS AGO   RIGHT FORE ARM   LUMBAR LAMINECTOMY/DECOMPRESSION MICRODISCECTOMY Right 04/13/2018   Procedure: Decompression lumbar laminectomy L5-S1 right;  Surgeon: Heide Ingle, MD;  Location: [facility];  Service: Orthopedics;  Laterality: Right;   SURGERY LEFT LEG AND RIGHT ANKLE  2011   PLATES IN BOTH    Family History family history includes Colon polyps in his father.  Social History Social History   Socioeconomic History   Marital status: Married    Spouse name: Not on file   Number of children: Not on file   Years of education: Not on file   Highest education level: Not on file  Occupational History   Not on file  Tobacco Use   Smoking status: Former    Current packs/day: 1.00    Average packs/day: 1 pack/day for 17.0 years (17.0 ttl pk-yrs)    Types: Cigarettes   Smokeless tobacco: Former    Types: Chew   Tobacco comments:    QUIT 2005  Vaping Use   Vaping status: Never Used  Substance and Sexual Activity   Alcohol use: Not Currently   Drug use: No   Sexual activity: Not on file  Other Topics Concern   Not on file  Social History Narrative   Not on file   Social Drivers of Health   Financial Resource Strain: Not on file  Food Insecurity: Not on file  Transportation Needs: Not on file  Physical Activity: Not on file  Stress: Not on file  Social Connections: Not on file  Intimate Partner  Violence: Not on file    Lab Results  Component Value Date   HGBA1C 8.6 (A)  09/18/2024   HGBA1C 8.6 (A) 07/10/2024   HGBA1C 9.2 (A) 04/03/2024   Lab Results  Component Value Date   CHOL 148 09/18/2024   Lab Results  Component Value Date   HDL 50 09/18/2024   Lab Results  Component Value Date   LDLCALC 82 09/18/2024   Lab Results  Component Value Date   TRIG 77 09/18/2024   Lab Results  Component Value Date   CHOLHDL 3.0 09/18/2024   Lab Results  Component Value Date   CREATININE 0.80 09/18/2024   Lab Results  Component Value Date   GFR 108.52 09/02/2023   Lab Results  Component Value Date   MICROALBUR 0.5 09/18/2024       Component Value Date/Time   NA 140 09/18/2024 1026   K 4.5 09/18/2024 1026   CL 102 09/18/2024 1026   CO2 29 09/18/2024 1026   GLUCOSE 147 (H) 09/18/2024 1026   BUN 21 09/18/2024 1026   CREATININE 0.80 09/18/2024 1026   CALCIUM 10.2 09/18/2024 1026   PROT 6.7 09/18/2024 1026   ALBUMIN 4.6 09/02/2023 0857   AST 18 09/18/2024 1026   ALT 24 09/18/2024 1026   ALKPHOS 57 09/02/2023 0857   BILITOT 0.5 09/18/2024 1026   GFRNONAA >60 04/08/2018 0904   GFRAA >60 04/08/2018 0904      Latest Ref Rng & Units 09/18/2024   10:26 AM 09/02/2023    8:57 AM 04/08/2018    9:04 AM  BMP  Glucose 65 - 99 mg/dL 852  846  883   BUN 7 - 25 mg/dL 21  21  19    Creatinine 0.70 - 1.30 mg/dL 9.19  9.32  9.19   BUN/Creat Ratio 6 - 22 (calc) SEE NOTE:     Sodium 135 - 146 mmol/L 140  139  139   Potassium 3.5 - 5.3 mmol/L 4.5  4.1  3.8   Chloride 98 - 110 mmol/L 102  101  103   CO2 20 - 32 mmol/L 29  29  27    Calcium 8.6 - 10.3 mg/dL 89.7  9.7  9.3        Component Value Date/Time   WBC 7.0 04/08/2018 0904   RBC 5.34 04/08/2018 0904   HGB 16.3 04/08/2018 0904   HCT 47.6 04/08/2018 0904   PLT 198 04/08/2018 0904   MCV 89.1 04/08/2018 0904   MCH 30.5 04/08/2018 0904   MCHC 34.2 04/08/2018 0904   RDW 12.3 04/08/2018 0904   LYMPHSABS 2.9 04/08/2018 0904   MONOABS 0.5 04/08/2018 0904   EOSABS 0.2 04/08/2018 0904   BASOSABS 0.0  04/08/2018 0904     Parts of this note may have been dictated using voice recognition software. There may be variances in spelling and vocabulary which are unintentional. Not all errors are proofread. Please notify the dino if any discrepancies are noted or if the meaning of any statement is not clear.

## 2024-09-26 NOTE — Patient Instructions (Signed)
 Will recommend the following: Pioglitazone  30 mg every day  Novolog  70/30: 22 units twice a day 15 min before meals. Glimepiride  2 mg Q12 hours as needed for blood sugar 200

## 2024-12-27 ENCOUNTER — Ambulatory Visit: Admitting: "Endocrinology
